# Patient Record
Sex: Female | Born: 1955 | Race: White | Hispanic: No | Marital: Married | Smoking: Never smoker
Health system: Southern US, Community
[De-identification: ages and names within clinical notes are randomized; demographics above are authoritative.]

## PROBLEM LIST (undated history)

## (undated) DIAGNOSIS — F419 Anxiety disorder, unspecified: Secondary | ICD-10-CM

## (undated) DIAGNOSIS — Z9889 Other specified postprocedural states: Secondary | ICD-10-CM

## (undated) DIAGNOSIS — R569 Unspecified convulsions: Secondary | ICD-10-CM

## (undated) DIAGNOSIS — T7840XA Allergy, unspecified, initial encounter: Secondary | ICD-10-CM

## (undated) DIAGNOSIS — I10 Essential (primary) hypertension: Secondary | ICD-10-CM

## (undated) DIAGNOSIS — T4145XA Adverse effect of unspecified anesthetic, initial encounter: Secondary | ICD-10-CM

## (undated) DIAGNOSIS — T8859XA Other complications of anesthesia, initial encounter: Secondary | ICD-10-CM

## (undated) DIAGNOSIS — R112 Nausea with vomiting, unspecified: Secondary | ICD-10-CM

## (undated) HISTORY — DX: Essential (primary) hypertension: I10

## (undated) HISTORY — DX: Allergy, unspecified, initial encounter: T78.40XA

## (undated) HISTORY — PX: TONSILLECTOMY: SUR1361

## (undated) HISTORY — DX: Anxiety disorder, unspecified: F41.9

## (undated) HISTORY — PX: SHOULDER ARTHROSCOPY: SHX128

## (undated) HISTORY — DX: Unspecified convulsions: R56.9

---

## 2004-04-03 ENCOUNTER — Ambulatory Visit: Payer: Self-pay | Admitting: Family Medicine

## 2004-05-20 ENCOUNTER — Ambulatory Visit: Payer: Self-pay | Admitting: Family Medicine

## 2004-05-20 ENCOUNTER — Other Ambulatory Visit: Admission: RE | Admit: 2004-05-20 | Discharge: 2004-05-20 | Payer: Self-pay | Admitting: Family Medicine

## 2005-04-14 ENCOUNTER — Ambulatory Visit: Payer: Self-pay | Admitting: Family Medicine

## 2006-04-25 ENCOUNTER — Ambulatory Visit: Payer: Self-pay | Admitting: Family Medicine

## 2006-05-23 ENCOUNTER — Ambulatory Visit: Payer: Self-pay | Admitting: Family Medicine

## 2006-05-24 ENCOUNTER — Encounter (INDEPENDENT_AMBULATORY_CARE_PROVIDER_SITE_OTHER): Payer: Self-pay | Admitting: Internal Medicine

## 2006-05-24 ENCOUNTER — Ambulatory Visit: Payer: Self-pay | Admitting: Internal Medicine

## 2006-05-28 ENCOUNTER — Encounter: Admission: RE | Admit: 2006-05-28 | Discharge: 2006-05-28 | Payer: Self-pay | Admitting: Family Medicine

## 2007-05-31 DIAGNOSIS — K2100 Gastro-esophageal reflux disease with esophagitis, without bleeding: Secondary | ICD-10-CM | POA: Insufficient documentation

## 2007-09-28 DIAGNOSIS — F4320 Adjustment disorder, unspecified: Secondary | ICD-10-CM | POA: Insufficient documentation

## 2007-10-27 DIAGNOSIS — G4700 Insomnia, unspecified: Secondary | ICD-10-CM | POA: Insufficient documentation

## 2007-12-21 ENCOUNTER — Ambulatory Visit: Payer: Self-pay | Admitting: Gastroenterology

## 2007-12-21 LAB — HM COLONOSCOPY

## 2009-06-18 ENCOUNTER — Encounter: Payer: Self-pay | Admitting: General Practice

## 2009-06-22 ENCOUNTER — Encounter: Payer: Self-pay | Admitting: General Practice

## 2009-07-08 ENCOUNTER — Ambulatory Visit: Payer: Self-pay | Admitting: Family Medicine

## 2009-07-23 ENCOUNTER — Encounter: Payer: Self-pay | Admitting: General Practice

## 2009-08-22 ENCOUNTER — Encounter: Payer: Self-pay | Admitting: General Practice

## 2009-09-22 ENCOUNTER — Encounter: Payer: Self-pay | Admitting: General Practice

## 2009-10-14 ENCOUNTER — Encounter: Payer: Self-pay | Admitting: Podiatry

## 2009-10-23 ENCOUNTER — Encounter: Payer: Self-pay | Admitting: Podiatry

## 2009-11-22 ENCOUNTER — Encounter: Payer: Self-pay | Admitting: Podiatry

## 2014-12-27 ENCOUNTER — Telehealth: Payer: Self-pay | Admitting: Family Medicine

## 2014-12-27 NOTE — Telephone Encounter (Signed)
Unfortunately needs ov. Not been here since EPIC conversion. Thanks.

## 2014-12-27 NOTE — Telephone Encounter (Signed)
Pt is requesting a referral to Endocrinology at [facility]. Pt stated that she has discussed this with Dr. Venia Minks before. I advised she may need an OV for referral. Please advise. Thanks TNP

## 2014-12-30 NOTE — Telephone Encounter (Signed)
LMTCB 12/30/2014  Thanks,   -Mickel Baas

## 2015-01-20 ENCOUNTER — Other Ambulatory Visit: Payer: Self-pay | Admitting: Family Medicine

## 2015-01-20 DIAGNOSIS — G4700 Insomnia, unspecified: Secondary | ICD-10-CM

## 2015-01-21 ENCOUNTER — Other Ambulatory Visit: Payer: Self-pay | Admitting: Family Medicine

## 2015-01-21 NOTE — Telephone Encounter (Signed)
Printed, please fax or call in to pharmacy. Thank you.   

## 2015-03-23 ENCOUNTER — Other Ambulatory Visit: Payer: Self-pay | Admitting: Family Medicine

## 2015-03-23 DIAGNOSIS — I10 Essential (primary) hypertension: Secondary | ICD-10-CM

## 2015-03-24 ENCOUNTER — Other Ambulatory Visit: Payer: Self-pay

## 2015-03-24 DIAGNOSIS — I10 Essential (primary) hypertension: Secondary | ICD-10-CM | POA: Insufficient documentation

## 2015-03-24 MED ORDER — TRIAMTERENE-HCTZ 37.5-25 MG PO TABS: 1.0000 | ORAL_TABLET | Freq: Every day | ORAL | Status: DC

## 2015-03-24 NOTE — Telephone Encounter (Signed)
05/20/14 last ov

## 2015-03-24 NOTE — Telephone Encounter (Signed)
Last OV 04/2014   

## 2015-04-30 ENCOUNTER — Other Ambulatory Visit: Payer: Self-pay

## 2015-04-30 DIAGNOSIS — I10 Essential (primary) hypertension: Secondary | ICD-10-CM

## 2015-04-30 DIAGNOSIS — G4700 Insomnia, unspecified: Secondary | ICD-10-CM

## 2015-04-30 MED ORDER — CLONAZEPAM 1 MG PO TABS: 0.5000 mg | ORAL_TABLET | Freq: Every day | ORAL | Status: DC

## 2015-04-30 MED ORDER — TRIAMTERENE-HCTZ 37.5-25 MG PO TABS: 1.0000 | ORAL_TABLET | Freq: Every day | ORAL | Status: DC

## 2015-04-30 NOTE — Telephone Encounter (Signed)
Patient needs Maxzide and Klonopin (just as listed in her medication list directions the same)_refilled to [facility]. LOV looks like from last message was March 2016, advised her wife that she will probably need appointment but will let the doctor decide. Please review. [phone]

## 2015-04-30 NOTE — Telephone Encounter (Signed)
Ok to refill? Please review.

## 2015-04-30 NOTE — Telephone Encounter (Signed)
Printed, please fax or call in to pharmacy. Thank you.   

## 2015-05-08 ENCOUNTER — Other Ambulatory Visit: Payer: Self-pay

## 2015-05-08 DIAGNOSIS — G4700 Insomnia, unspecified: Secondary | ICD-10-CM

## 2015-05-08 DIAGNOSIS — I10 Essential (primary) hypertension: Secondary | ICD-10-CM

## 2015-05-08 MED ORDER — CLONAZEPAM 1 MG PO TABS: 0.5000 mg | ORAL_TABLET | Freq: Every day | ORAL | Status: DC

## 2015-05-08 MED ORDER — TRIAMTERENE-HCTZ 37.5-25 MG PO TABS: 1.0000 | ORAL_TABLET | Freq: Every day | ORAL | Status: DC

## 2015-05-08 NOTE — Telephone Encounter (Signed)
Printed, please fax or call in to pharmacy. Thank you.   

## 2015-05-15 ENCOUNTER — Other Ambulatory Visit: Payer: Self-pay | Admitting: Family Medicine

## 2015-05-15 DIAGNOSIS — I10 Essential (primary) hypertension: Secondary | ICD-10-CM

## 2015-05-27 ENCOUNTER — Telehealth: Payer: Self-pay | Admitting: Family Medicine

## 2015-05-28 ENCOUNTER — Ambulatory Visit (INDEPENDENT_AMBULATORY_CARE_PROVIDER_SITE_OTHER): Payer: 59 | Admitting: Family Medicine

## 2015-05-28 ENCOUNTER — Encounter: Payer: Self-pay | Admitting: Family Medicine

## 2015-05-28 VITALS — BP 118/76 | HR 76 | Temp 98.6°F | Resp 16 | Ht 59.0 in | Wt 150.0 lb

## 2015-05-28 DIAGNOSIS — M722 Plantar fascial fibromatosis: Secondary | ICD-10-CM | POA: Insufficient documentation

## 2015-05-28 DIAGNOSIS — G4700 Insomnia, unspecified: Secondary | ICD-10-CM

## 2015-05-28 DIAGNOSIS — I10 Essential (primary) hypertension: Secondary | ICD-10-CM

## 2015-05-28 DIAGNOSIS — K594 Anal spasm: Secondary | ICD-10-CM | POA: Insufficient documentation

## 2015-05-28 DIAGNOSIS — H905 Unspecified sensorineural hearing loss: Secondary | ICD-10-CM | POA: Insufficient documentation

## 2015-05-28 DIAGNOSIS — F4322 Adjustment disorder with anxiety: Secondary | ICD-10-CM | POA: Diagnosis not present

## 2015-05-28 DIAGNOSIS — K2100 Gastro-esophageal reflux disease with esophagitis, without bleeding: Secondary | ICD-10-CM

## 2015-05-28 MED ORDER — CLONAZEPAM 1 MG PO TABS: ORAL_TABLET | ORAL | Status: DC

## 2015-05-28 MED ORDER — TRIAMTERENE-HCTZ 37.5-25 MG PO TABS: ORAL_TABLET | ORAL | Status: DC

## 2015-05-28 MED ORDER — CLONAZEPAM 1 MG PO TABS: 0.5000 mg | ORAL_TABLET | Freq: Every day | ORAL | Status: DC

## 2015-05-28 NOTE — Progress Notes (Signed)
Patient ID: Danielle Ortega, female   DOB: 08/15/1955, 60 y.o.   MRN: [id number]         Patient: Danielle Ortega Female    DOB: 16-Sep-1955   60 y.o.   MRN: [id number] Visit Date: 05/28/2015  Today's Provider: Margarita Rana, MD   No chief complaint on file.  Subjective:    Hypertension This is a chronic problem. The problem is unchanged. The problem is controlled. Associated symptoms include anxiety and malaise/fatigue. Pertinent negatives include no chest pain, headaches, palpitations or shortness of breath. There are no compliance problems.   Insomnia Primary symptoms: fragmented sleep, sleep disturbance, malaise/fatigue.  The problem has been gradually worsening since onset. How many beverages per day that contain caffeine: 0 - 1.  Types of beverages you drink: coffee. Past treatments include medication. Typical bedtime:  8-10 P.M..  How long after going to bed to you fall asleep: 15-30 minutes.   PMH includes: work related stressors.  Gastroesophageal Reflux She complains of heartburn and a hoarse voice. She reports no chest pain, no choking, no coughing, no nausea or no sore throat. This is a chronic problem. The heartburn wakes her from sleep. The symptoms are aggravated by certain foods and stress (Eat later in the day). Associated symptoms include fatigue.  Rectal Pain  Has been going on for several years. She describes it as a "Muscle Spasm" it comes and goes.  Pt says it is very random can happen anytime day or night, will wake pt up if it happens at night.     Not on File Previous Medications   ASCORBIC ACID (VITAMIN C) 1000 MG TABLET    Take by mouth.   ASPIRIN 325 MG TABLET    Take by mouth.   CHOLECALCIFEROL (VITAMIN D) 1000 UNITS TABLET    Take by mouth.   CLONAZEPAM (KLONOPIN) 1 MG TABLET    Take 0.5 tablets (0.5 mg total) by mouth at bedtime. Pt needs to schedule an office visit before anymore refills.   CRANBERRY-VITAMIN C-VITAMIN E (CRANBERRY PLUS VITAMIN C) 4200-20-3  MG-MG-UNIT CAPS    Take by mouth.   IBUPROFEN (ADVIL) 200 MG TABLET       KRILL OIL 1000 MG CAPS    Take by mouth.   MISC NATURAL PRODUCTS (GLUCOSAMINE CHONDROITIN COMPLX) TABS    Take by mouth.   PROBIOTIC CAPS    Take by mouth.   TRIAMTERENE-HYDROCHLOROTHIAZIDE (MAXZIDE-25) 37.5-25 MG TABLET    TAKE 1 TABLET BY MOUTH  DAILY.    Review of Systems  Constitutional: Positive for malaise/fatigue and fatigue. Negative for fever, diaphoresis, activity change, appetite change and unexpected weight change.  HENT: Positive for hoarse voice. Negative for sore throat.   Respiratory: Negative.  Negative for cough, choking and shortness of breath.   Cardiovascular: Positive for leg swelling. Negative for chest pain and palpitations.  Gastrointestinal: Positive for heartburn and rectal pain (Has been going on for several years. She describes it as a "Muscle Spasm" it comes an goes.). Negative for nausea, vomiting, diarrhea, constipation, blood in stool, abdominal distention and anal bleeding.  Neurological: Negative for dizziness, light-headedness and headaches.  Psychiatric/Behavioral: Positive for sleep disturbance. Negative for suicidal ideas, behavioral problems, confusion, self-injury, dysphoric mood, decreased concentration and agitation. The patient is nervous/anxious (Work related stress. ) and has insomnia. The patient is not hyperactive.     Social History  Substance Use Topics  . Smoking status: Never Smoker   . Smokeless tobacco: Never Used  .  Alcohol Use: Yes     Comment: Rarely   Objective:   BP 118/76 mmHg  Pulse 76  Temp(Src) 98.6 F (37 C) (Oral)  Resp 16  Ht 4\' 11"  (1.499 m)  Wt 150 lb (68.04 kg)  BMI 30.28 kg/m2  Physical Exam  Constitutional: She is oriented to person, place, and time. She appears well-developed and well-nourished.  Cardiovascular: Normal rate, regular rhythm and normal heart sounds.   Pulmonary/Chest: Effort normal and breath sounds normal.    Neurological: She is alert and oriented to person, place, and time.  Psychiatric: She has a normal mood and affect. Her behavior is normal. Judgment and thought content normal.      Assessment & Plan:     1. Essential hypertension, benign Condition is stable. Please continue current medication and  plan of care as noted.   - triamterene-hydrochlorothiazide (MAXZIDE-25) 37.5-25 MG tablet; TAKE 1 TABLET BY MOUTH  DAILY.  Dispense: 90 tablet; Refill: 1 - CBC with Differential/Platelet - Lipid panel - TSH - Comprehensive metabolic panel  2. Adjustment disorder with anxious mood Stable.  Continue current treatment.    3. Insomnia Stable. Continue current medication and plan of care.   - clonazePAM (KLONOPIN) 1 MG tablet; 1/2 - 1 at bedtime for insomnia.  Dispense: 60 tablet; Refill: 1 - TSH  4. Esophagitis, reflux Condition is stable. Please continue current medication and  plan of care as noted.   - CBC with Differential/Platelet - Lipid panel - TSH - Comprehensive metabolic panel   5. Proctalgia fugax Intermittent.  Did improve with Klonopin. Will take as needed.      Patient was seen and examined by Jerrell Belfast, MD, and note scribed by Ashley Royalty, CMA.  I have reviewed the document for accuracy and completeness and I agree with above. - Jerrell Belfast, MD   Margarita Rana, MD  [facility]

## 2015-06-06 ENCOUNTER — Telehealth: Payer: Self-pay

## 2015-06-06 DIAGNOSIS — E870 Hyperosmolality and hypernatremia: Secondary | ICD-10-CM

## 2015-06-06 LAB — COMPREHENSIVE METABOLIC PANEL
ALT: 31 IU/L (ref 0–32)
AST: 21 IU/L (ref 0–40)
Albumin/Globulin Ratio: 2.4 — ABNORMAL HIGH (ref 1.2–2.2)
Albumin: 4.7 g/dL (ref 3.6–4.8)
Alkaline Phosphatase: 52 IU/L (ref 39–117)
BUN/Creatinine Ratio: 16 (ref 12–28)
BUN: 14 mg/dL (ref 8–27)
Bilirubin Total: 0.4 mg/dL (ref 0.0–1.2)
CO2: 27 mmol/L (ref 18–29)
Calcium: 10.4 mg/dL — ABNORMAL HIGH (ref 8.7–10.3)
Chloride: 100 mmol/L (ref 96–106)
Creatinine, Ser: 0.9 mg/dL (ref 0.57–1.00)
GFR calc Af Amer: 79 mL/min/{1.73_m2} (ref >59)
GFR calc non Af Amer: 69 mL/min/{1.73_m2} (ref >59)
Globulin, Total: 2 g/dL (ref 1.5–4.5)
Glucose: 98 mg/dL (ref 65–99)
Potassium: 4.6 mmol/L (ref 3.5–5.2)
Sodium: 148 mmol/L — ABNORMAL HIGH (ref 134–144)
Total Protein: 6.7 g/dL (ref 6.0–8.5)

## 2015-06-06 LAB — CBC WITH DIFFERENTIAL/PLATELET
Basophils Absolute: 0 10*3/uL (ref 0.0–0.2)
Basos: 1 %
EOS (ABSOLUTE): 0.2 10*3/uL (ref 0.0–0.4)
Eos: 4 %
Hematocrit: 44.2 % (ref 34.0–46.6)
Hemoglobin: 14.9 g/dL (ref 11.1–15.9)
Immature Grans (Abs): 0 10*3/uL (ref 0.0–0.1)
Immature Granulocytes: 0 %
Lymphocytes Absolute: 1.8 10*3/uL (ref 0.7–3.1)
Lymphs: 34 %
MCH: 29.9 pg (ref 26.6–33.0)
MCHC: 33.7 g/dL (ref 31.5–35.7)
MCV: 89 fL (ref 79–97)
Monocytes Absolute: 0.4 10*3/uL (ref 0.1–0.9)
Monocytes: 7 %
Neutrophils Absolute: 2.9 10*3/uL (ref 1.4–7.0)
Neutrophils: 54 %
Platelets: 190 10*3/uL (ref 150–379)
RBC: 4.98 x10E6/uL (ref 3.77–5.28)
RDW: 13.7 % (ref 12.3–15.4)
WBC: 5.3 10*3/uL (ref 3.4–10.8)

## 2015-06-06 LAB — LIPID PANEL
Chol/HDL Ratio: 2.5 ratio units (ref 0.0–4.4)
Cholesterol, Total: 185 mg/dL (ref 100–199)
HDL: 74 mg/dL (ref >39)
LDL Calculated: 79 mg/dL (ref 0–99)
Triglycerides: 158 mg/dL — ABNORMAL HIGH (ref 0–149)
VLDL Cholesterol Cal: 32 mg/dL (ref 5–40)

## 2015-06-06 LAB — TSH: TSH: 3.37 u[IU]/mL (ref 0.450–4.500)

## 2015-06-06 NOTE — Telephone Encounter (Signed)
-----  Message from Margarita Rana, MD sent at 06/06/2015 10:51 AM EDT ----- Labs stable except for mildly elevated calcium and sodium. Would recommend stay well hydrated and recheck met c and PTH in one week. Thanks.

## 2015-06-06 NOTE — Telephone Encounter (Signed)
Advised pt of lab results. Pt verbally acknowledges understanding. Printed off lab req. Renaldo Fiddler, CMA

## 2015-07-08 ENCOUNTER — Other Ambulatory Visit: Payer: Self-pay

## 2015-07-08 DIAGNOSIS — G4700 Insomnia, unspecified: Secondary | ICD-10-CM

## 2015-07-08 MED ORDER — CLONAZEPAM 1 MG PO TABS: ORAL_TABLET | ORAL | Status: DC

## 2015-07-08 NOTE — Telephone Encounter (Signed)
Printed, please fax or call in to pharmacy. Thank you.   

## 2015-08-18 ENCOUNTER — Encounter: Payer: Self-pay | Admitting: Family Medicine

## 2015-08-18 ENCOUNTER — Ambulatory Visit
Admission: RE | Admit: 2015-08-18 | Discharge: 2015-08-18 | Disposition: A | Discharge status: Home / Self Care | Payer: 59 | Source: Ambulatory Visit | Attending: Family Medicine | Admitting: Family Medicine

## 2015-08-18 ENCOUNTER — Ambulatory Visit (INDEPENDENT_AMBULATORY_CARE_PROVIDER_SITE_OTHER): Payer: 59 | Admitting: Family Medicine

## 2015-08-18 ENCOUNTER — Other Ambulatory Visit: Payer: Self-pay | Admitting: Family Medicine

## 2015-08-18 VITALS — BP 96/56 | HR 76 | Temp 98.7°F | Resp 16 | Ht 59.0 in | Wt 146.0 lb

## 2015-08-18 DIAGNOSIS — R1010 Upper abdominal pain, unspecified: Secondary | ICD-10-CM

## 2015-08-18 DIAGNOSIS — M545 Low back pain: Secondary | ICD-10-CM

## 2015-08-18 DIAGNOSIS — R0789 Other chest pain: Secondary | ICD-10-CM

## 2015-08-18 DIAGNOSIS — K2100 Gastro-esophageal reflux disease with esophagitis, without bleeding: Secondary | ICD-10-CM

## 2015-08-18 MED ORDER — OMEPRAZOLE 20 MG PO CPDR: 20.0000 mg | DELAYED_RELEASE_CAPSULE | Freq: Every day | ORAL | Status: DC

## 2015-08-18 NOTE — Progress Notes (Signed)
Patient: Danielle Ortega Female    DOB: 06/23/1955   60 y.o.   MRN: [id number] Visit Date: 08/18/2015  Today's Provider: Margarita Rana, MD   Chief Complaint  Patient presents with  . Abdominal Pain   Subjective:    HPI Patient reports pain in upper abdomin and back pain times 2 weeks, worsening. Patient reports pain is constant all day. Reports pain is worse today with movement. Patient denies any injury, nausea, vomiting, or cough. Patient denies acid reflux or heartburn symptoms. Patient reports that she was bit by a horse fly 2 weeks ago on her right leg. Patient not sure if that bite is causing her symptoms. Patient reports that she stopped taking her antiacid medications a few weeks ago.     No Known Allergies Current Meds  Medication Sig  . Ascorbic Acid (VITAMIN C) 1000 MG tablet Take by mouth.  Marland Kitchen aspirin 325 MG tablet Take by mouth.  . clonazePAM (KLONOPIN) 1 MG tablet 1/2 - 1 at bedtime for insomnia.  . Cranberry-Vitamin C-Vitamin E (CRANBERRY PLUS VITAMIN C) 4200-20-3 MG-MG-UNIT CAPS Take by mouth.  Javier Docker Oil 1000 MG CAPS Take by mouth.  . Misc Natural Products (GLUCOSAMINE CHONDROITIN COMPLX) TABS Take by mouth.  . Probiotic CAPS Take by mouth.  . triamterene-hydrochlorothiazide (MAXZIDE-25) 37.5-25 MG tablet TAKE 1 TABLET BY MOUTH  DAILY.  . [DISCONTINUED] cholecalciferol (VITAMIN D) 1000 units tablet Take by mouth.  . [DISCONTINUED] ibuprofen (ADVIL) 200 MG tablet     Review of Systems  Constitutional: Negative.   Respiratory: Negative.   Musculoskeletal: Positive for myalgias and back pain.    Social History  Substance Use Topics  . Smoking status: Never Smoker   . Smokeless tobacco: Never Used  . Alcohol Use: Yes     Comment: Rarely   Objective:   BP 96/56 mmHg  Pulse 76  Temp(Src) 98.7 F (37.1 C) (Oral)  Resp 16  Ht 4\' 11"  (1.499 m)  Wt 146 lb (66.225 kg)  BMI 29.47 kg/m2  Physical Exam  Constitutional: She is oriented to person, place,  and time. She appears well-developed and well-nourished.  Cardiovascular: Normal rate, regular rhythm and normal heart sounds.   Abdominal: She exhibits distension. Bowel sounds are increased. There is no CVA tenderness.  Neurological: She is alert and oriented to person, place, and time.  Psychiatric: She has a normal mood and affect. Her behavior is normal. Judgment and thought content normal.      Assessment & Plan:     1. Atypical chest pain New problem. EKG no change.   Worsening. Only mild improvement with GI cocktail.  F/U pending lab report and ultrasound. Stressed importance of going to ER if worsening CP or SOB or new symptoms.  - EKG 12-Lead - GI COCKTAIL UP TO 45 CC - CBC with Differential/Platelet - Comprehensive metabolic panel - Amylase - Lipase  2. Pain of upper abdomen New problem. F/U pending report. - US Abdomen Limited RUQ; Future  3. Esophagitis, reflux Recurrent. Worsening. Patient started on omeprazole 20 mg daily.  4. Midline low back pain, with sciatica presence unspecified F/U pending lab report. - DG Thoracic Spine 1 View; Future      Patient seen and examined by Dr. Jerrell Belfast, and note scribed by Philbert Riser. Dimas, CMA.  I have reviewed the document for accuracy and completeness and I agree with above. - Jerrell Belfast, MD   Margarita Rana, MD  [facility]  [facility]  

## 2015-08-19 LAB — COMPREHENSIVE METABOLIC PANEL
ALT: 29 IU/L (ref 0–32)
AST: 21 IU/L (ref 0–40)
Albumin/Globulin Ratio: 1.8 (ref 1.2–2.2)
Albumin: 4.6 g/dL (ref 3.6–4.8)
Alkaline Phosphatase: 53 IU/L (ref 39–117)
BUN/Creatinine Ratio: 17 (ref 12–28)
BUN: 19 mg/dL (ref 8–27)
Bilirubin Total: 0.3 mg/dL (ref 0.0–1.2)
CO2: 29 mmol/L (ref 18–29)
Calcium: 10.9 mg/dL — ABNORMAL HIGH (ref 8.7–10.3)
Chloride: 105 mmol/L (ref 96–106)
Creatinine, Ser: 1.09 mg/dL — ABNORMAL HIGH (ref 0.57–1.00)
GFR calc Af Amer: 63 mL/min/{1.73_m2} (ref >59)
GFR calc non Af Amer: 55 mL/min/{1.73_m2} — ABNORMAL LOW (ref >59)
Globulin, Total: 2.6 g/dL (ref 1.5–4.5)
Glucose: 93 mg/dL (ref 65–99)
Potassium: 4.1 mmol/L (ref 3.5–5.2)
Sodium: 150 mmol/L — ABNORMAL HIGH (ref 134–144)
Total Protein: 7.2 g/dL (ref 6.0–8.5)

## 2015-08-19 LAB — LIPASE: Lipase: 23 U/L (ref 0–59)

## 2015-08-19 LAB — CBC WITH DIFFERENTIAL/PLATELET
Basophils Absolute: 0 10*3/uL (ref 0.0–0.2)
Basos: 1 %
EOS (ABSOLUTE): 0.2 10*3/uL (ref 0.0–0.4)
Eos: 4 %
Hematocrit: 43.4 % (ref 34.0–46.6)
Hemoglobin: 14.9 g/dL (ref 11.1–15.9)
Immature Grans (Abs): 0 10*3/uL (ref 0.0–0.1)
Immature Granulocytes: 0 %
Lymphocytes Absolute: 1.8 10*3/uL (ref 0.7–3.1)
Lymphs: 34 %
MCH: 30.8 pg (ref 26.6–33.0)
MCHC: 34.3 g/dL (ref 31.5–35.7)
MCV: 90 fL (ref 79–97)
Monocytes Absolute: 0.3 10*3/uL (ref 0.1–0.9)
Monocytes: 6 %
Neutrophils Absolute: 2.9 10*3/uL (ref 1.4–7.0)
Neutrophils: 55 %
Platelets: 190 10*3/uL (ref 150–379)
RBC: 4.84 x10E6/uL (ref 3.77–5.28)
RDW: 13.7 % (ref 12.3–15.4)
WBC: 5.3 10*3/uL (ref 3.4–10.8)

## 2015-08-19 LAB — AMYLASE: Amylase: 56 U/L (ref 31–124)

## 2015-08-20 ENCOUNTER — Telehealth: Payer: Self-pay

## 2015-08-20 NOTE — Telephone Encounter (Signed)
Pt advised; She says she is feeling much better.  She is going to get her labs today.   Thanks,   -Mickel Baas

## 2015-08-20 NOTE — Telephone Encounter (Signed)
-----   Message from Margarita Rana, MD sent at 08/19/2015  3:08 PM EDT ----- Xray negative.   Sodium and calcium still up. Needs repeat calcium with PTH. Please see how patient is feeling. Thanks.

## 2015-08-21 ENCOUNTER — Ambulatory Visit
Admission: RE | Admit: 2015-08-21 | Discharge: 2015-08-21 | Disposition: A | Discharge status: Home / Self Care | Payer: 59 | Source: Ambulatory Visit | Attending: Family Medicine | Admitting: Family Medicine

## 2015-08-21 DIAGNOSIS — K760 Fatty (change of) liver, not elsewhere classified: Secondary | ICD-10-CM | POA: Diagnosis not present

## 2015-08-21 DIAGNOSIS — R1010 Upper abdominal pain, unspecified: Secondary | ICD-10-CM | POA: Diagnosis not present

## 2015-08-21 LAB — COMPREHENSIVE METABOLIC PANEL
ALT: 27 IU/L (ref 0–32)
AST: 23 IU/L (ref 0–40)
Albumin/Globulin Ratio: 2.4 — ABNORMAL HIGH (ref 1.2–2.2)
Albumin: 4.6 g/dL (ref 3.6–4.8)
Alkaline Phosphatase: 50 IU/L (ref 39–117)
BUN/Creatinine Ratio: 19 (ref 12–28)
BUN: 18 mg/dL (ref 8–27)
Bilirubin Total: 0.3 mg/dL (ref 0.0–1.2)
CO2: 24 mmol/L (ref 18–29)
Calcium: 10.6 mg/dL — ABNORMAL HIGH (ref 8.7–10.3)
Chloride: 101 mmol/L (ref 96–106)
Creatinine, Ser: 0.96 mg/dL (ref 0.57–1.00)
GFR calc Af Amer: 73 mL/min/{1.73_m2} (ref >59)
GFR calc non Af Amer: 64 mL/min/{1.73_m2} (ref >59)
Globulin, Total: 1.9 g/dL (ref 1.5–4.5)
Glucose: 95 mg/dL (ref 65–99)
Potassium: 4.8 mmol/L (ref 3.5–5.2)
Sodium: 145 mmol/L — ABNORMAL HIGH (ref 134–144)
Total Protein: 6.5 g/dL (ref 6.0–8.5)

## 2015-08-21 LAB — PARATHYROID HORMONE, INTACT (NO CA)
PTH: 56 pg/mL (ref 15–65)
PTH: 35 pg/mL (ref 15–65)

## 2015-08-21 LAB — SPECIMEN STATUS REPORT

## 2015-11-12 ENCOUNTER — Encounter: Payer: Self-pay | Admitting: Physician Assistant

## 2015-11-12 ENCOUNTER — Ambulatory Visit (INDEPENDENT_AMBULATORY_CARE_PROVIDER_SITE_OTHER): Payer: 59 | Admitting: Physician Assistant

## 2015-11-12 VITALS — BP 112/72 | HR 71 | Temp 98.2°F | Resp 14 | Ht 59.5 in | Wt 144.8 lb

## 2015-11-12 DIAGNOSIS — K2100 Gastro-esophageal reflux disease with esophagitis, without bleeding: Secondary | ICD-10-CM

## 2015-11-12 DIAGNOSIS — Z1239 Encounter for other screening for malignant neoplasm of breast: Secondary | ICD-10-CM | POA: Diagnosis not present

## 2015-11-12 DIAGNOSIS — I10 Essential (primary) hypertension: Secondary | ICD-10-CM

## 2015-11-12 DIAGNOSIS — G4700 Insomnia, unspecified: Secondary | ICD-10-CM

## 2015-11-12 DIAGNOSIS — Z1159 Encounter for screening for other viral diseases: Secondary | ICD-10-CM

## 2015-11-12 DIAGNOSIS — Z0000 Encounter for general adult medical examination without abnormal findings: Secondary | ICD-10-CM | POA: Diagnosis not present

## 2015-11-12 MED ORDER — OMEPRAZOLE 20 MG PO CPDR: 20.0000 mg | DELAYED_RELEASE_CAPSULE | Freq: Every day | ORAL | 3 refills | Status: DC

## 2015-11-12 MED ORDER — TRIAMTERENE-HCTZ 37.5-25 MG PO TABS: ORAL_TABLET | ORAL | 3 refills | Status: DC

## 2015-11-12 MED ORDER — CLONAZEPAM 1 MG PO TABS: ORAL_TABLET | ORAL | 1 refills | Status: DC

## 2015-11-12 NOTE — Patient Instructions (Signed)
Health Maintenance, Female Adopting a healthy lifestyle and getting preventive care can go a long way to promote health and wellness. Talk with your health care provider about what schedule of regular examinations is right for you. This is a good chance for you to check in with your provider about disease prevention and staying healthy. In between checkups, there are plenty of things you can do on your own. Experts have done a lot of research about which lifestyle changes and preventive measures are most likely to keep you healthy. Ask your health care provider for more information. WEIGHT AND DIET  Eat a healthy diet  Be sure to include plenty of vegetables, fruits, low-fat dairy products, and lean protein.  Do not eat a lot of foods high in solid fats, added sugars, or salt.  Get regular exercise. This is one of the most important things you can do for your health.  Most adults should exercise for at least 150 minutes each week. The exercise should increase your heart rate and make you sweat (moderate-intensity exercise).  Most adults should also do strengthening exercises at least twice a week. This is in addition to the moderate-intensity exercise.  Maintain a healthy weight  Body mass index (BMI) is a measurement that can be used to identify possible weight problems. It estimates body fat based on height and weight. Your health care provider can help determine your BMI and help you achieve or maintain a healthy weight.  For females 15 years of age and older:   A BMI below 18.5 is considered underweight.  A BMI of 18.5 to 24.9 is normal.  A BMI of 25 to 29.9 is considered overweight.  A BMI of 30 and above is considered obese.  Watch levels of cholesterol and blood lipids  You should start having your blood tested for lipids and cholesterol at 62 years of age, then have this test every 5 years.  You may need to have your cholesterol levels checked more often if:  Your lipid  or cholesterol levels are high.  You are older than 62 years of age.  You are at high risk for heart disease.  CANCER SCREENING   Lung Cancer  Lung cancer screening is recommended for adults 85-86 years old who are at high risk for lung cancer because of a history of smoking.  A yearly low-dose CT scan of the lungs is recommended for people who:  Currently smoke.  Have quit within the past 15 years.  Have at least a 30-pack-year history of smoking. A pack year is smoking an average of one pack of cigarettes a day for 1 year.  Yearly screening should continue until it has been 15 years since you quit.  Yearly screening should stop if you develop a health problem that would prevent you from having lung cancer treatment.  Breast Cancer  Practice breast self-awareness. This means understanding how your breasts normally appear and feel.  It also means doing regular breast self-exams. Let your health care provider know about any changes, no matter how small.  If you are in your 20s or 30s, you should have a clinical breast exam (CBE) by a health care provider every 1-3 years as part of a regular health exam.  If you are 39 or older, have a CBE every year. Also consider having a breast X-ray (mammogram) every year.  If you have a family history of breast cancer, talk to your health care provider about genetic screening.  If you  are at high risk for breast cancer, talk to your health care provider about having an MRI and a mammogram every year.  Breast cancer gene (BRCA) assessment is recommended for women who have family members with BRCA-related cancers. BRCA-related cancers include:  Breast.  Ovarian.  Tubal.  Peritoneal cancers.  Results of the assessment will determine the need for genetic counseling and BRCA1 and BRCA2 testing. Cervical Cancer Your health care provider may recommend that you be screened regularly for cancer of the pelvic organs (ovaries, uterus, and  vagina). This screening involves a pelvic examination, including checking for microscopic changes to the surface of your cervix (Pap test). You may be encouraged to have this screening done every 3 years, beginning at age 68.  For women ages 63-65, health care providers may recommend pelvic exams and Pap testing every 3 years, or they may recommend the Pap and pelvic exam, combined with testing for human papilloma virus (HPV), every 5 years. Some types of HPV increase your risk of cervical cancer. Testing for HPV may also be done on women of any age with unclear Pap test results.  Other health care providers may not recommend any screening for nonpregnant women who are considered low risk for pelvic cancer and who do not have symptoms. Ask your health care provider if a screening pelvic exam is right for you.  If you have had past treatment for cervical cancer or a condition that could lead to cancer, you need Pap tests and screening for cancer for at least 20 years after your treatment. If Pap tests have been discontinued, your risk factors (such as having a new sexual partner) need to be reassessed to determine if screening should resume. Some women have medical problems that increase the chance of getting cervical cancer. In these cases, your health care provider may recommend more frequent screening and Pap tests. Colorectal Cancer  This type of cancer can be detected and often prevented.  Routine colorectal cancer screening usually begins at 62 years of age and continues through 61 years of age.  Your health care provider may recommend screening at an earlier age if you have risk factors for colon cancer.  Your health care provider may also recommend using home test kits to check for hidden blood in the stool.  A small camera at the end of a tube can be used to examine your colon directly (sigmoidoscopy or colonoscopy). This is done to check for the earliest forms of colorectal  cancer.  Routine screening usually begins at age 70.  Direct examination of the colon should be repeated every 5-10 years through 62 years of age. However, you may need to be screened more often if early forms of precancerous polyps or small growths are found. Skin Cancer  Check your skin from head to toe regularly.  Tell your health care provider about any new moles or changes in moles, especially if there is a change in a mole's shape or color.  Also tell your health care provider if you have a mole that is larger than the size of a pencil eraser.  Always use sunscreen. Apply sunscreen liberally and repeatedly throughout the day.  Protect yourself by wearing long sleeves, pants, a wide-brimmed hat, and sunglasses whenever you are outside. HEART DISEASE, DIABETES, AND HIGH BLOOD PRESSURE   High blood pressure causes heart disease and increases the risk of stroke. High blood pressure is more likely to develop in:  People who have blood pressure in the high end  of the normal range (130-139/85-89 mm Hg).  People who are overweight or obese.  People who are African American.  If you are 38-23 years of age, have your blood pressure checked every 3-5 years. If you are 61 years of age or older, have your blood pressure checked every year. You should have your blood pressure measured twice--once when you are at a hospital or clinic, and once when you are not at a hospital or clinic. Record the average of the two measurements. To check your blood pressure when you are not at a hospital or clinic, you can use:  An automated blood pressure machine at a pharmacy.  A home blood pressure monitor.  If you are between 45 years and 39 years old, ask your health care provider if you should take aspirin to prevent strokes.  Have regular diabetes screenings. This involves taking a blood sample to check your fasting blood sugar level.  If you are at a normal weight and have a low risk for diabetes,  have this test once every three years after 62 years of age.  If you are overweight and have a high risk for diabetes, consider being tested at a younger age or more often. PREVENTING INFECTION  Hepatitis B  If you have a higher risk for hepatitis B, you should be screened for this virus. You are considered at high risk for hepatitis B if:  You were born in a country where hepatitis B is common. Ask your health care provider which countries are considered high risk.  Your parents were born in a high-risk country, and you have not been immunized against hepatitis B (hepatitis B vaccine).  You have HIV or AIDS.  You use needles to inject street drugs.  You live with someone who has hepatitis B.  You have had sex with someone who has hepatitis B.  You get hemodialysis treatment.  You take certain medicines for conditions, including cancer, organ transplantation, and autoimmune conditions. Hepatitis C  Blood testing is recommended for:  Everyone born from [birth year] through 1967.  Anyone with known risk factors for hepatitis C. Sexually transmitted infections (STIs)  You should be screened for sexually transmitted infections (STIs) including gonorrhea and chlamydia if:  You are sexually active and are younger than 62 years of age.  You are older than 62 years of age and your health care provider tells you that you are at risk for this type of infection.  Your sexual activity has changed since you were last screened and you are at an increased risk for chlamydia or gonorrhea. Ask your health care provider if you are at risk.  If you do not have HIV, but are at risk, it may be recommended that you take a prescription medicine daily to prevent HIV infection. This is called pre-exposure prophylaxis (PrEP). You are considered at risk if:  You are sexually active and do not regularly use condoms or know the HIV status of your partner(s).  You take drugs by injection.  You are sexually  active with a partner who has HIV. Talk with your health care provider about whether you are at high risk of being infected with HIV. If you choose to begin PrEP, you should first be tested for HIV. You should then be tested every 3 months for as long as you are taking PrEP.  PREGNANCY   If you are premenopausal and you may become pregnant, ask your health care provider about preconception counseling.  If you may  become pregnant, take 400 to 800 micrograms (mcg) of folic acid every day.  If you want to prevent pregnancy, talk to your health care provider about birth control (contraception). OSTEOPOROSIS AND MENOPAUSE   Osteoporosis is a disease in which the bones lose minerals and strength with aging. This can result in serious bone fractures. Your risk for osteoporosis can be identified using a bone density scan.  If you are 74 years of age or older, or if you are at risk for osteoporosis and fractures, ask your health care provider if you should be screened.  Ask your health care provider whether you should take a calcium or vitamin D supplement to lower your risk for osteoporosis.  Menopause may have certain physical symptoms and risks.  Hormone replacement therapy may reduce some of these symptoms and risks. Talk to your health care provider about whether hormone replacement therapy is right for you.  HOME CARE INSTRUCTIONS   Schedule regular health, dental, and eye exams.  Stay current with your immunizations.   Do not use any tobacco products including cigarettes, chewing tobacco, or electronic cigarettes.  If you are pregnant, do not drink alcohol.  If you are breastfeeding, limit how much and how often you drink alcohol.  Limit alcohol intake to no more than 1 drink per day for nonpregnant women. One drink equals 12 ounces of beer, 5 ounces of wine, or 1 ounces of hard liquor.  Do not use street drugs.  Do not share needles.  Ask your health care provider for help if  you need support or information about quitting drugs.  Tell your health care provider if you often feel depressed.  Tell your health care provider if you have ever been abused or do not feel safe at home.   This information is not intended to replace advice given to you by your health care provider. Make sure you discuss any questions you have with your health care provider.   Document Released: 08/24/2010 Document Revised: 03/01/2014 Document Reviewed: 01/10/2013 Elsevier Interactive Patient Education [employer].

## 2015-11-12 NOTE — Progress Notes (Signed)
Patient: Danielle Ortega, Female    DOB: 11-05-1955, 60 y.o.   MRN: [id number] Visit Date: 11/12/2015  Today's Provider: Mar Daring, PA-C   Chief Complaint  Patient presents with  . Annual Exam   Subjective:    Annual physical exam Danielle Ortega is a 60 y.o. female who presents today for health maintenance and complete physical. She feels well. She reports exercising none. She reports she is sleeping well (average 7.5 hours per night).  ----------------------------------------------------------------- Colonoscopy: 12/21/2007 polyp WNL Dr. Allen Norris Mammo: 10/18/2007 BI Rads 1 Pap Smear: 10/05/2007  Review of Systems  Constitutional: Positive for fatigue.       Irritability   HENT: Positive for voice change.   Eyes: Negative.   Respiratory: Negative.   Cardiovascular: Positive for leg swelling.  Gastrointestinal: Positive for rectal pain (sporadically ;over last 15 years).  Endocrine: Negative.   Genitourinary: Negative.   Musculoskeletal: Negative.   Skin: Negative.   Allergic/Immunologic: Negative.   Neurological: Negative.   Hematological: Negative.   Psychiatric/Behavioral: Negative.     Social History      She  reports that she has never smoked. She has never used smokeless tobacco. She reports that she drinks alcohol. She reports that she does not use drugs.       Social History   Social History  . Marital status: Married    Spouse name: N/A  . Number of children: N/A  . Years of education: N/A   Social History Main Topics  . Smoking status: Never Smoker  . Smokeless tobacco: Never Used  . Alcohol use Yes     Comment: Rarely  . Drug use: No  . Sexual activity: Not Asked   Other Topics Concern  . None   Social History Narrative  . None    No past medical history on file.   Patient Active Problem List   Diagnosis Date Noted  . Serum calcium elevated 08/20/2015  . Plantar fasciitis 05/28/2015  . 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 Essential hypertension, benign 03/24/2015  . Insomnia 10/27/2007  . 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 Esophagitis, reflux 05/31/2007    Past Surgical History:  Procedure Laterality Date  . SHOULDER ARTHROSCOPY Left   . TONSILLECTOMY      Family History        Family Status  Relation Status  . Mother Deceased  . Father Alive  . Brother Alive        Her family history includes Hypertension in her father; Lung cancer in her mother.    No Known Allergies  Current Meds  Medication Sig  . Ascorbic Acid (VITAMIN C) 1000 MG tablet Take by mouth.  Marland Kitchen aspirin 325 MG tablet Take by mouth.  . clonazePAM (KLONOPIN) 1 MG tablet 1/2 - 1 at bedtime for insomnia.  . Cranberry-Vitamin C-Vitamin E (CRANBERRY PLUS VITAMIN C) 4200-20-3 MG-MG-UNIT CAPS Take by mouth.  . Digestive Enzymes (DIGESTIVE ENZYME PO) Take by mouth.  Javier Docker Oil 1000 MG CAPS Take by mouth.  . Misc Natural Products (GLUCOSAMINE CHONDROITIN COMPLX) TABS Take by mouth.  Marland Kitchen omeprazole (PRILOSEC) 20 MG capsule Take 1 capsule (20 mg total) by mouth daily.  Marland Kitchen OVER THE COUNTER MEDICATION   . Probiotic CAPS Take by mouth.  . triamterene-hydrochlorothiazide (MAXZIDE-25) 37.5-25 MG tablet TAKE 1 TABLET BY MOUTH  DAILY.    Patient Care Team: Margarita Rana, MD as PCP - General (Family Medicine)  Objective:   Vitals: BP 112/72 (BP Location: Right Arm, Patient Position: Sitting, Cuff Size: Normal)   Pulse 71   Temp 98.2 F (36.8 C) (Oral)   Resp 14   Ht 4' 11.5" (1.511 m)   Wt 144 lb 12.8 oz (65.7 kg)   BMI 28.76 kg/m    Physical Exam  Constitutional: She is oriented to person, place, and time. She appears well-developed and well-nourished. No distress.  HENT:  Head: Normocephalic and atraumatic.  Right Ear: Tympanic membrane, external ear and ear canal normal.  Left Ear: Tympanic membrane, external ear and ear canal normal.  Nose: Nose normal.  Mouth/Throat: Uvula is midline,  oropharynx is clear and moist and mucous membranes are normal. No oropharyngeal exudate.  Eyes: Conjunctivae and EOM are normal. Pupils are equal, round, and reactive to light. Right eye exhibits no discharge. Left eye exhibits no discharge. No scleral icterus.  Neck: Normal range of motion. Neck supple. No JVD present. No tracheal deviation present. No thyromegaly present.  Cardiovascular: Normal rate, regular rhythm, normal heart sounds and intact distal pulses.  Exam reveals no gallop and no friction rub.   No murmur heard. Pulmonary/Chest: Effort normal and breath sounds normal. No respiratory distress. She has no wheezes. She has no rales. She exhibits no tenderness. Right breast exhibits no inverted nipple, no mass, no nipple discharge, no skin change and no tenderness. Left breast exhibits no inverted nipple, no mass, no nipple discharge, no skin change and no tenderness. Breasts are symmetrical.  Abdominal: Soft. Bowel sounds are normal. She exhibits no distension and no mass. There is no tenderness. There is no rebound and no guarding.  Genitourinary:  Genitourinary Comments: Patient refused due to normal paps.  Musculoskeletal: Normal range of motion. She exhibits no edema or tenderness.  Lymphadenopathy:    She has no cervical adenopathy.  Neurological: She is alert and oriented to person, place, and time.  Skin: Skin is warm and dry. No rash noted. She is not diaphoretic.  Psychiatric: She has a normal mood and affect. Her behavior is normal. Judgment and thought content normal.  Vitals reviewed.    Depression Screen No flowsheet data found.    Assessment & Plan:     Routine Health Maintenance and Physical Exam  Exercise Activities and Dietary recommendations Goals    None       There is no immunization history on file for this patient.  Health Maintenance  Topic Date Due  . Hepatitis C Screening  Dec 16, 1955  . HIV Screening  05/16/1968  . TETANUS/TDAP   05/16/1972  . PAP SMEAR  05/17/1974  . MAMMOGRAM  05/17/2003  . COLONOSCOPY  05/17/2003  . ZOSTAVAX  05/16/2013  . INFLUENZA VACCINE  09/23/2015      Discussed health benefits of physical activity, and encouraged her to engage in regular exercise appropriate for her age and condition.    1. Annual physical exam Normal physical exam today. Will follow up in one year at her next annual physical exam. She is to call the office in the meantime if she has any acute issue, questions or concerns.  2. Breast cancer screening Breast exam today was normal. There is no family history of breast cancer. She does perform regular self breast exams. Mammogram was ordered as below. Information for [facility] clinic was given to patient so she may schedule her mammogram at her convenience. - Mammogram Digital Screening; Future  3. Essential hypertension, benign Stable. Diagnosis pulled for medication refill. Continue  current medical treatment plan. - triamterene-hydrochlorothiazide (MAXZIDE-25) 37.5-25 MG tablet; TAKE 1 TABLET BY MOUTH  DAILY.  Dispense: 90 tablet; Refill: 3  4. Need for hepatitis C screening test - Hepatitis C antibody screen  5. Esophagitis, reflux Stable. Diagnosis pulled for medication refill. Continue current medical treatment plan. - omeprazole (PRILOSEC) 20 MG capsule; Take 1 capsule (20 mg total) by mouth daily.  Dispense: 90 capsule; Refill: 3  6. Insomnia Stable. Diagnosis pulled for medication refill. Continue current medical treatment plan. - clonazePAM (KLONOPIN) 1 MG tablet; 1/2 - 1 at bedtime for insomnia.  Dispense: 90 tablet; Refill: 1  --------------------------------------------------------------------    Mar Daring, PA-C  [facility]

## 2016-01-14 ENCOUNTER — Ambulatory Visit (INDEPENDENT_AMBULATORY_CARE_PROVIDER_SITE_OTHER): Payer: 59 | Admitting: Physician Assistant

## 2016-01-14 ENCOUNTER — Encounter: Payer: Self-pay | Admitting: Physician Assistant

## 2016-01-14 VITALS — BP 138/84 | HR 68 | Temp 98.5°F | Resp 16 | Wt 149.0 lb

## 2016-01-14 DIAGNOSIS — J029 Acute pharyngitis, unspecified: Secondary | ICD-10-CM | POA: Diagnosis not present

## 2016-01-14 DIAGNOSIS — R059 Cough, unspecified: Secondary | ICD-10-CM

## 2016-01-14 DIAGNOSIS — R05 Cough: Secondary | ICD-10-CM

## 2016-01-14 LAB — POCT RAPID STREP A (OFFICE): Rapid Strep A Screen: NEGATIVE

## 2016-01-14 MED ORDER — HYDROCODONE-HOMATROPINE 5-1.5 MG/5ML PO SYRP: 5.0000 mL | ORAL_SOLUTION | Freq: Three times a day (TID) | ORAL | 0 refills | Status: DC | PRN

## 2016-01-14 NOTE — Patient Instructions (Signed)
Infeccin del tracto respiratorio superior, adultos (Upper Respiratory Infection, Adult) La mayora de las infecciones del tracto respiratorio superior estn causadas por un virus. Un infeccin del tracto respiratorio superior afecta la nariz, la garganta y las vas respiratorias superiores. El tipo ms comn de infeccin del tracto respiratorio superior es el resfro comn. [place] los medicamentos solamente como se lo haya indicado el mdico.  A fin de Best boy de garganta, haga grgaras con solucin salina templada o consuma caramelos para la tos, como se lo haya indicado el mdico.  Use un humidificador de vapor clido o inhale el vapor de la ducha para aumentar la humedad del aire. Esto facilitar la respiracin.  Beba suficiente lquido para mantener el pis (orina) claro o de color amarillo plido.  Tome sopas y caldos transparentes.  Siga una dieta saludable.  Descanse todo lo que sea necesario.  Regrese al [place] cuando la fiebre haya desaparecido o el mdico le diga que puede [place].  Es posible que deba quedarse en su casa durante un tiempo prolongado, para no transmitir la infeccin a los dems.  [place] usar un barbijo y lavarse las manos con frecuencia para evitar el contagio del virus.  Si tiene asma, use el inhalador con mayor frecuencia.  No consuma ningn producto que contenga tabaco, lo que incluye cigarrillos, tabaco de Higher education careers adviser o Psychologist, sport and exercise. Si necesita ayuda para dejar de fumar, consulte al mdico. SOLICITE AYUDA SI:  Siente que empeora o que no mejora.  Los medicamentos no logran [employer].  Tiene escalofros.  La dificultad para Museum/gallery exhibitions officer.  Tiene mucosidad marrn o roja.  Tiene una secrecin amarilla o marrn de la Lawyer.  Le duele la cara, especialmente al inclinarse hacia adelante.  Tiene fiebre.  Tiene los ganglios del cuello hinchados.  Siente dolor al tragar.  Tiene zonas  blancas en la parte de atrs de la garganta. SOLICITE AYUDA DE INMEDIATO SI:  Los siguientes sntomas son muy intensos o constantes:  Dolor de [place].  Dolor de odos.  Dolor en la frente, detrs de los ojos y por encima de los pmulos (dolor sinusal).  Dolor en el pecho.  Tiene enfermedad pulmonar prolongada (crnica) y cualquiera de estos sntomas:  Sibilancias.  Tos prolongada.  Tos con sangre.  Cambio en la mucosidad habitual.  Presenta rigidez en el cuello.  Tiene cambios en:  La visin.  La audicin.  El pensamiento.  El [place] de nimo. ASEGRESE DE QUE:  Comprende estas instrucciones.  Controlar su afeccin.  Recibir ayuda de inmediato si no mejora o si empeora. Esta informacin no tiene Marine scientist el consejo del mdico. Asegrese de hacerle al mdico cualquier pregunta que tenga. Document Released: 07/13/2010 Document Revised: 06/25/2014 Document Reviewed: 05/16/2013 Elsevier Interactive Patient Education  2017 [employer].

## 2016-01-14 NOTE — Progress Notes (Signed)
Patient: Danielle Ortega Female    DOB: 1955/04/03   60 y.o.   MRN: [id number] Visit Date: 01/14/2016  Today's Provider: Trinna Post, PA-C   Chief Complaint  Patient presents with  . URI   Subjective:    HPI Pt is here for cough, congestion, headache, sore throat that has been going on for several days. She reports that the worse part of it is the sore throat. She denies fever, chills, sweats but has had some body aches. Denies nausea and vomiting.     No Known Allergies   Current Outpatient Prescriptions:  .  Ascorbic Acid (VITAMIN C) 1000 MG tablet, Take by mouth., Disp: , Rfl:  .  aspirin 325 MG tablet, Take by mouth., Disp: , Rfl:  .  clonazePAM (KLONOPIN) 1 MG tablet, 1/2 - 1 at bedtime for insomnia., Disp: 90 tablet, Rfl: 1 .  Cranberry-Vitamin C-Vitamin E (CRANBERRY PLUS VITAMIN C) 4200-20-3 MG-MG-UNIT CAPS, Take by mouth., Disp: , Rfl:  .  Digestive Enzymes (DIGESTIVE ENZYME PO), Take by mouth., Disp: , Rfl:  .  Krill Oil 1000 MG CAPS, Take by mouth., Disp: , Rfl:  .  Misc Natural Products (GLUCOSAMINE CHONDROITIN COMPLX) TABS, Take by mouth., Disp: , Rfl:  .  omeprazole (PRILOSEC) 20 MG capsule, Take 1 capsule (20 mg total) by mouth daily., Disp: 90 capsule, Rfl: 3 .  Probiotic CAPS, Take by mouth., Disp: , Rfl:  .  triamterene-hydrochlorothiazide ([id number]) 37.5-25 MG tablet, TAKE 1 TABLET BY MOUTH  DAILY., Disp: 90 tablet, Rfl: 3 .  FLUVIRIN 0.5 ML SUSY, ADM 0.5ML IM UTD, Disp: , Rfl: 0 .  OVER THE COUNTER MEDICATION, , Disp: , Rfl:   Review of Systems  Constitutional: Positive for fatigue.  HENT: Positive for congestion, postnasal drip and sore throat.   Eyes: Negative.   Respiratory: Positive for cough.   Cardiovascular: Negative.   Gastrointestinal: Negative.   Endocrine: Negative.   Genitourinary: Negative.   Musculoskeletal: Positive for myalgias.  Skin: Negative.   Allergic/Immunologic: Negative.   Neurological: Negative.   Hematological:  Negative.   Psychiatric/Behavioral: Negative.     Social History  Substance Use Topics  . Smoking status: Never Smoker  . Smokeless tobacco: Never Used  . Alcohol use Yes     Comment: Rarely   Objective:   BP 138/84 (BP Location: Left Arm, Patient Position: Sitting, Cuff Size: Normal)   Pulse 68   Temp 98.5 F (36.9 C) (Oral)   Resp 16   Wt 149 lb (67.6 kg)   SpO2 98%   BMI 29.59 kg/m   Physical Exam  Constitutional: She appears well-developed and well-nourished.  HENT:  Right Ear: External ear normal.  Left Ear: External ear normal.  Mouth/Throat: Posterior oropharyngeal erythema present. No oropharyngeal exudate, posterior oropharyngeal edema or tonsillar abscesses.  Eyes: Right eye exhibits discharge. Left eye exhibits discharge.  Neck: Neck supple.  Cardiovascular: Normal rate and regular rhythm.   Pulmonary/Chest: Effort normal and breath sounds normal. No respiratory distress. She has no rales.  Lymphadenopathy:    She has cervical adenopathy.  Skin: Skin is warm and dry.  Psychiatric: She has a normal mood and affect. Her behavior is normal.        Assessment & Plan:      Problem List Items Addressed This Visit    None    Visit Diagnoses    Cough    -  Primary   Relevant Medications  HYDROcodone-homatropine (HYCODAN) 5-1.5 MG/5ML syrup   Sore throat       Relevant Orders   POCT rapid strep A (Completed)      Patient is 60 y/o coming in today with upper respiratory infection, likely viral. She has not tried anything for her symptoms. Rapid strep in office today was negative. Will treat symptomatically with Tylenol and cough medicine.  No Follow-up on file.  There are no Patient Instructions on file for this visit.  The entirety of the information documented in the History of Present Illness, Review of Systems and Physical Exam were personally obtained by me. Portions of this information were initially documented by Ashley Royalty, CMA and reviewed by  me for thoroughness and accuracy.        Trinna Post, PA-C  [facility]

## 2016-05-06 ENCOUNTER — Ambulatory Visit (INDEPENDENT_AMBULATORY_CARE_PROVIDER_SITE_OTHER): Payer: 59 | Admitting: Physician Assistant

## 2016-05-06 ENCOUNTER — Encounter: Payer: Self-pay | Admitting: Physician Assistant

## 2016-05-06 VITALS — BP 104/72 | HR 60 | Temp 97.7°F | Resp 16 | Wt 134.0 lb

## 2016-05-06 DIAGNOSIS — R0981 Nasal congestion: Secondary | ICD-10-CM

## 2016-05-06 DIAGNOSIS — J0110 Acute frontal sinusitis, unspecified: Secondary | ICD-10-CM | POA: Diagnosis not present

## 2016-05-06 DIAGNOSIS — R05 Cough: Secondary | ICD-10-CM

## 2016-05-06 DIAGNOSIS — R059 Cough, unspecified: Secondary | ICD-10-CM

## 2016-05-06 MED ORDER — HYDROCODONE-HOMATROPINE 5-1.5 MG/5ML PO SYRP
5.0000 mL | ORAL_SOLUTION | Freq: Every evening | ORAL | 0 refills | Status: DC | PRN
Start: 2016-05-06 — End: 2016-12-27

## 2016-05-06 MED ORDER — AMOXICILLIN-POT CLAVULANATE 875-125 MG PO TABS: 1.0000 | ORAL_TABLET | Freq: Two times a day (BID) | ORAL | 0 refills | Status: DC

## 2016-05-06 MED ORDER — FLUTICASONE PROPIONATE 50 MCG/ACT NA SUSP
2.0000 | Freq: Every day | NASAL | 6 refills | Status: DC
Start: 2016-05-06 — End: 2016-05-06

## 2016-05-06 MED ORDER — FLUTICASONE PROPIONATE 50 MCG/ACT NA SUSP: 2.0000 | Freq: Every day | NASAL | 6 refills | Status: DC

## 2016-05-06 NOTE — Progress Notes (Signed)
Patient: Danielle Ortega Female    DOB: 1955-06-21   61 y.o.   MRN: [id number] Visit Date: 05/06/2016  Today's Provider: Trinna Post, PA-C   Chief Complaint  Patient presents with  . URI   Subjective:    URI   This is a recurrent problem. The current episode started in the past 7 days (over the last 5 days). The problem has been gradually worsening. Associated symptoms include congestion, coughing, headaches, sinus pain and swollen glands. She has tried decongestant and acetaminophen (Mucinex and robutussion) for the symptoms. The treatment provided mild relief.       No Known Allergies   Current Outpatient Prescriptions:  .  Ascorbic Acid (VITAMIN C) 1000 MG tablet, Take by mouth., Disp: , Rfl:  .  aspirin 325 MG tablet, Take by mouth., Disp: , Rfl:  .  clonazePAM (KLONOPIN) 1 MG tablet, 1/2 - 1 at bedtime for insomnia., Disp: 90 tablet, Rfl: 1 .  Cranberry-Vitamin C-Vitamin E (CRANBERRY PLUS VITAMIN C) 4200-20-3 MG-MG-UNIT CAPS, Take by mouth., Disp: , Rfl:  .  Digestive Enzymes (DIGESTIVE ENZYME PO), Take by mouth., Disp: , Rfl:  .  FLUVIRIN 0.5 ML SUSY, ADM 0.5ML IM UTD, Disp: , Rfl: 0 .  HYDROcodone-homatropine (HYCODAN) 5-1.5 MG/5ML syrup, Take 5 mLs by mouth every 8 (eight) hours as needed for cough., Disp: 120 mL, Rfl: 0 .  Krill Oil 1000 MG CAPS, Take by mouth., Disp: , Rfl:  .  Misc Natural Products (GLUCOSAMINE CHONDROITIN COMPLX) TABS, Take by mouth., Disp: , Rfl:  .  omeprazole (PRILOSEC) 20 MG capsule, Take 1 capsule (20 mg total) by mouth daily., Disp: 90 capsule, Rfl: 3 .  Probiotic CAPS, Take by mouth., Disp: , Rfl:  .  triamterene-hydrochlorothiazide ([id number]-76) 37.5-25 MG tablet, TAKE 1 TABLET BY MOUTH  DAILY., Disp: 90 tablet, Rfl: 3 .  OVER THE COUNTER MEDICATION, , Disp: , Rfl:   Review of Systems  HENT: Positive for congestion and sinus pain.   Respiratory: Positive for cough.   Neurological: Positive for headaches.    Social History    Substance Use Topics  . Smoking status: Never Smoker  . Smokeless tobacco: Never Used  . Alcohol use Yes     Comment: Rarely   Objective:   BP 104/72 (BP Location: Left Arm, Patient Position: Sitting, Cuff Size: Normal)   Pulse 60   Temp 97.7 F (36.5 C)   Resp 16   Wt 134 lb (60.8 kg)   BMI 26.61 kg/m  Vitals:   05/06/16 1506  BP: 104/72  Pulse: 60  Resp: 16  Temp: 97.7 F (36.5 C)  Weight: 134 lb (60.8 kg)     Physical Exam  Constitutional: She is oriented to person, place, and time. She appears well-developed and well-nourished. No distress.  HENT:  Right Ear: External ear normal.  Left Ear: External ear normal.  Nose: Right sinus exhibits maxillary sinus tenderness and frontal sinus tenderness. Left sinus exhibits maxillary sinus tenderness and frontal sinus tenderness.  Mouth/Throat: Oropharynx is clear and moist. No oropharyngeal exudate, posterior oropharyngeal edema or posterior oropharyngeal erythema.  Tms opaque bilaterally   Eyes: Conjunctivae are normal. Right eye exhibits no discharge. Left eye exhibits no discharge.  Neck: Neck supple.  Cardiovascular: Normal rate and regular rhythm.   Pulmonary/Chest: Effort normal and breath sounds normal.  Lymphadenopathy:    She has cervical adenopathy.  Neurological: She is alert and oriented to person, place, and time.  Skin: Skin is warm and dry. She is not diaphoretic.  Psychiatric: She has a normal mood and affect. Her behavior is normal.        Assessment & Plan:     1. Acute non-recurrent frontal sinusitis  Also instructed pt to take 2nd gen antihistamine and flonase for better maintenance control of potential allergies.  - amoxicillin-clavulanate (AUGMENTIN) 875-125 MG tablet; Take 1 tablet by mouth 2 (two) times daily.  Dispense: 20 tablet; Refill: 0  2. Cough  - HYDROcodone-homatropine (HYCODAN) 5-1.5 MG/5ML syrup; Take 5 mLs by mouth at bedtime as needed for cough.  Dispense: 120 mL; Refill:  0  3. Sinus congestion  - fluticasone (FLONASE) 50 MCG/ACT nasal spray; Place 2 sprays into both nostrils daily.  Dispense: 16 g; Refill: 6  The entirety of the information documented in the History of Present Illness, Review of Systems and Physical Exam were personally obtained by me. Portions of this information were initially documented by Ashley Royalty, CMA and reviewed by me for thoroughness and accuracy.   Return if symptoms worsen or fail to improve.        Trinna Post, PA-C  [facility]

## 2016-05-06 NOTE — Patient Instructions (Signed)

## 2016-05-08 ENCOUNTER — Ambulatory Visit: Payer: 59 | Admitting: Family Medicine

## 2016-05-08 ENCOUNTER — Telehealth: Payer: Self-pay | Admitting: Family Medicine

## 2016-05-08 MED ORDER — AZITHROMYCIN 250 MG PO TABS: ORAL_TABLET | ORAL | 0 refills | Status: DC

## 2016-05-08 NOTE — Telephone Encounter (Signed)
Pt's spouse Izora Gala called b/c pt still hasn't gotten her amoxicillin-clavulanate (AUGMENTIN) 875-125 MG tablet b/c it was sent to mail order pharmacy and since they have already filed the insurance [facility] can't fill the medication. Izora Gala is requesting we do something to fix this where they can get that medication or call something else in. Izora Gala thinks that pt is getting worse. [place] Please advise. Thanks TNP

## 2016-05-08 NOTE — Telephone Encounter (Signed)
I called [facility] but the doctor line to the pharmacy was closed and only open Monday through Friday. Called [facility] and called in [place] per Dr Caryn Section and pharmacist said this is going through with her insurance. Mrs Danielle Ortega advised. Apologized for the mix up and run around.-aa

## 2016-05-11 ENCOUNTER — Ambulatory Visit
Admission: RE | Admit: 2016-05-11 | Discharge: 2016-05-11 | Disposition: A | Discharge status: Home / Self Care | Payer: 59 | Source: Ambulatory Visit | Attending: Physician Assistant | Admitting: Physician Assistant

## 2016-05-11 ENCOUNTER — Telehealth: Payer: Self-pay

## 2016-05-11 ENCOUNTER — Telehealth: Payer: Self-pay | Admitting: Physician Assistant

## 2016-05-11 DIAGNOSIS — R05 Cough: Secondary | ICD-10-CM | POA: Diagnosis not present

## 2016-05-11 DIAGNOSIS — R059 Cough, unspecified: Secondary | ICD-10-CM

## 2016-05-11 DIAGNOSIS — R079 Chest pain, unspecified: Secondary | ICD-10-CM | POA: Diagnosis not present

## 2016-05-11 NOTE — Telephone Encounter (Signed)
Pt was seen on 05/06/16 with Danielle Ortega. Pt stated that the amoxicillin was sent to mail order pharmacy and she called Saturday and azithromycin (ZITHROMAX) 250 MG tablet was sent to pharmacy. Pt stated that she has been taking the medication as directed and she is still having pain in her chest front and back. Pt stated that she would like to go ahead and get a chest Xray. Pt stated that she hasn't received the amoxicillin from the mail order pharmacy. Please advise. Thanks TNP

## 2016-05-11 NOTE — Telephone Encounter (Signed)
Do apologize for mix up. Have ordered CXR. Patient may get this on walk in basis at [facility]. Will advise further upon results of CXR.

## 2016-05-11 NOTE — Telephone Encounter (Signed)
Pt advised.  She will get her chest X-ray today.   Thanks,   -Mickel Baas

## 2016-05-11 NOTE — Telephone Encounter (Signed)
Pt advised and agrees with treatment plan. Aymen Widrig Drozdowski, CMA  

## 2016-05-11 NOTE — Telephone Encounter (Signed)
-----   Message from Trinna Post, [place] sent at 05/11/2016  1:35 PM EDT ----- CXR normal. If augmentin has come in mail, she can discontinue Z-pack and start course of augmentin.

## 2016-05-18 ENCOUNTER — Telehealth: Payer: Self-pay | Admitting: Physician Assistant

## 2016-05-18 NOTE — Telephone Encounter (Signed)
Fabio Bering do you want to change the Rx? ED

## 2016-05-18 NOTE — Telephone Encounter (Signed)
Pt saw Adriana 05/06/16 & 05/11/16. Pt stated that she has been taking amoxicillin-clavulanate (AUGMENTIN) 875-125 MG tablet and for the last 4 days she has had water diarrhea and she is very fatigued b/c of the diarrhea. Pt wanted to know if she should continue taking the medication b/c she has 4 days left or if she can try a different medication. Pharmacy: [facility] Please advise. Thanks TNP

## 2016-05-18 NOTE — Telephone Encounter (Signed)
Advised  ED 

## 2016-05-18 NOTE — Telephone Encounter (Signed)
If patient feels better respiratory wise, between z-pack and augmentin, she has been covered for respiratory pathogen and she can discontinue drug. No fevers or anything? Diarrhea should resolve shortly, call back if not. Thanks.

## 2016-05-22 DIAGNOSIS — R197 Diarrhea, unspecified: Secondary | ICD-10-CM | POA: Diagnosis not present

## 2016-10-12 ENCOUNTER — Other Ambulatory Visit: Payer: Self-pay | Admitting: Physician Assistant

## 2016-10-12 DIAGNOSIS — I10 Essential (primary) hypertension: Secondary | ICD-10-CM

## 2016-10-29 ENCOUNTER — Other Ambulatory Visit: Payer: Self-pay | Admitting: Physician Assistant

## 2016-10-29 DIAGNOSIS — K2100 Gastro-esophageal reflux disease with esophagitis, without bleeding: Secondary | ICD-10-CM

## 2016-11-12 ENCOUNTER — Encounter: Payer: 59 | Admitting: Physician Assistant

## 2016-11-15 ENCOUNTER — Encounter: Payer: 59 | Admitting: Physician Assistant

## 2016-11-15 NOTE — Progress Notes (Deleted)
Patient: Danielle Ortega, Female    DOB: 12/23/1955, 61 y.o.   MRN: [id number] Visit Date: 11/15/2016  Today's Provider: Mar Daring, PA-C   No chief complaint on file.  Subjective:    Annual physical exam [name] is a 61 y.o. female who presents today for health maintenance and complete physical. She feels {DESC; WELL/FAIRLY WELL/POORLY:18703}. She reports exercising ***. She reports she is sleeping {DESC; WELL/FAIRLY WELL/POORLY:18703}.  -----------------------------------------------------------------   Review of Systems  Social History      She  reports that she has never smoked. She has never used smokeless tobacco. She reports that she drinks alcohol. She reports that she does not use drugs.       Social History   Social History  . Marital status: Married    Spouse name: N/A  . Number of children: N/A  . Years of education: N/A   Social History Main Topics  . Smoking status: Never Smoker  . Smokeless tobacco: Never Used  . Alcohol use Yes     Comment: Rarely  . Drug use: No  . Sexual activity: Not on file   Other Topics Concern  . Not on file   Social History Narrative  . No narrative on file    No past medical history on file.   Patient Active Problem List   Diagnosis Date Noted  . Serum calcium elevated 08/20/2015  . Plantar fasciitis 05/28/2015  . 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 Essential hypertension, benign 03/24/2015  . Insomnia 10/27/2007  . 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 Esophagitis, reflux 05/31/2007    Past Surgical History:  Procedure Laterality Date  . SHOULDER ARTHROSCOPY Left   . TONSILLECTOMY      Family History        Family Status  Relation Status  . Mother Deceased  . Father Alive  . Brother Alive        Her family history includes Hypertension in her father; Lung cancer in her mother.     No Known Allergies   Current Outpatient Prescriptions:  .   amoxicillin-clavulanate (AUGMENTIN) 875-125 MG tablet, Take 1 tablet by mouth 2 (two) times daily., Disp: 20 tablet, Rfl: 0 .  Ascorbic Acid (VITAMIN C) 1000 MG tablet, Take by mouth., Disp: , Rfl:  .  aspirin 325 MG tablet, Take by mouth., Disp: , Rfl:  .  azithromycin (ZITHROMAX) 250 MG tablet, As directed, Disp: 6 tablet, Rfl: 0 .  clonazePAM (KLONOPIN) 1 MG tablet, 1/2 - 1 at bedtime for insomnia., Disp: 90 tablet, Rfl: 1 .  Cranberry-Vitamin C-Vitamin E (CRANBERRY PLUS VITAMIN C) 4200-20-3 MG-MG-UNIT CAPS, Take by mouth., Disp: , Rfl:  .  Digestive Enzymes (DIGESTIVE ENZYME PO), Take by mouth., Disp: , Rfl:  .  fluticasone (FLONASE) 50 MCG/ACT nasal spray, Place 2 sprays into both nostrils daily., Disp: 16 g, Rfl: 6 .  FLUVIRIN 0.5 ML SUSY, ADM 0.5ML IM UTD, Disp: , Rfl: 0 .  HYDROcodone-homatropine (HYCODAN) 5-1.5 MG/5ML syrup, Take 5 mLs by mouth at bedtime as needed for cough., Disp: 120 mL, Rfl: 0 .  Krill Oil 1000 MG CAPS, Take by mouth., Disp: , Rfl:  .  Misc Natural Products (GLUCOSAMINE CHONDROITIN COMPLX) TABS, Take by mouth., Disp: , Rfl:  .  omeprazole (PRILOSEC) 20 MG capsule, TAKE 1 CAPSULE BY MOUTH  DAILY, Disp: 90 capsule, Rfl: 1 .  OVER THE COUNTER MEDICATION, , Disp: , Rfl:  .  Probiotic CAPS, Take by mouth., Disp: , Rfl:  .  triamterene-hydrochlorothiazide (IPJASNK-53) 37.5-25 MG tablet, TAKE 1 TABLET BY MOUTH  DAILY, Disp: 90 tablet, Rfl: 1   Patient Care Team: Rubye Beach as PCP - General (Family Medicine)      Objective:   Vitals: There were no vitals taken for this visit.     Physical Exam  Constitutional: She is oriented to person, place, and time. She appears well-developed and well-nourished.  HENT:  Head: Normocephalic and atraumatic.  Right Ear: External ear normal.  Left Ear: External ear normal.  Nose: Nose normal.  Mouth/Throat: Oropharynx is clear and moist.  Eyes: Pupils are equal, round, and reactive to light. Conjunctivae and EOM  are normal.  Neck: Normal range of motion. Neck supple.  Cardiovascular: Normal rate, regular rhythm, normal heart sounds and intact distal pulses.   Pulmonary/Chest: Effort normal and breath sounds normal.  Abdominal: Soft. Bowel sounds are normal.  Musculoskeletal: Normal range of motion.  Neurological: She is alert and oriented to person, place, and time. She has normal reflexes.  Skin: Skin is warm and dry.  Psychiatric: She has a normal mood and affect. Her behavior is normal. Judgment and thought content normal.     Depression Screen No flowsheet data found.    Assessment & Plan:     Routine Health Maintenance and Physical Exam  Exercise Activities and Dietary recommendations Goals    None       There is no immunization history on file for this patient.  Health Maintenance  Topic Date Due  . Hepatitis C Screening  1955-11-17  . HIV Screening  05/16/1968  . TETANUS/TDAP  05/16/1972  . PAP SMEAR  05/17/1974  . MAMMOGRAM  05/17/2003  . COLONOSCOPY  05/17/2003  . INFLUENZA VACCINE  09/22/2016     Discussed health benefits of physical activity, and encouraged her to engage in regular exercise appropriate for her age and condition.    --------------------------------------------------------------------    Mar Daring, PA-C  [place]

## 2016-12-06 ENCOUNTER — Encounter: Payer: Self-pay | Admitting: Physician Assistant

## 2016-12-27 ENCOUNTER — Encounter: Payer: Self-pay | Admitting: Family Medicine

## 2016-12-27 ENCOUNTER — Ambulatory Visit (INDEPENDENT_AMBULATORY_CARE_PROVIDER_SITE_OTHER): Payer: 59 | Admitting: Family Medicine

## 2016-12-27 VITALS — BP 116/82 | HR 64 | Temp 98.6°F | Resp 16 | Ht 60.0 in | Wt 134.0 lb

## 2016-12-27 DIAGNOSIS — K2100 Gastro-esophageal reflux disease with esophagitis, without bleeding: Secondary | ICD-10-CM

## 2016-12-27 DIAGNOSIS — I10 Essential (primary) hypertension: Secondary | ICD-10-CM | POA: Diagnosis not present

## 2016-12-27 DIAGNOSIS — Z23 Encounter for immunization: Secondary | ICD-10-CM

## 2016-12-27 DIAGNOSIS — Z713 Dietary counseling and surveillance: Secondary | ICD-10-CM | POA: Insufficient documentation

## 2016-12-27 DIAGNOSIS — Z0000 Encounter for general adult medical examination without abnormal findings: Secondary | ICD-10-CM | POA: Diagnosis not present

## 2016-12-27 DIAGNOSIS — Z1231 Encounter for screening mammogram for malignant neoplasm of breast: Secondary | ICD-10-CM | POA: Diagnosis not present

## 2016-12-27 DIAGNOSIS — G4700 Insomnia, unspecified: Secondary | ICD-10-CM

## 2016-12-27 DIAGNOSIS — F4321 Adjustment disorder with depressed mood: Secondary | ICD-10-CM

## 2016-12-27 DIAGNOSIS — Z1239 Encounter for other screening for malignant neoplasm of breast: Secondary | ICD-10-CM

## 2016-12-27 DIAGNOSIS — E663 Overweight: Secondary | ICD-10-CM | POA: Diagnosis not present

## 2016-12-27 DIAGNOSIS — Z124 Encounter for screening for malignant neoplasm of cervix: Secondary | ICD-10-CM | POA: Diagnosis not present

## 2016-12-27 MED ORDER — CLONAZEPAM 1 MG PO TABS: ORAL_TABLET | ORAL | 3 refills | Status: DC

## 2016-12-27 MED ORDER — OMEPRAZOLE 20 MG PO CPDR: 20.0000 mg | DELAYED_RELEASE_CAPSULE | Freq: Every day | ORAL | 3 refills | Status: DC

## 2016-12-27 MED ORDER — TRIAMTERENE-HCTZ 37.5-25 MG PO CAPS: 1.0000 | ORAL_CAPSULE | Freq: Every day | ORAL | 2 refills | Status: DC

## 2016-12-27 NOTE — Progress Notes (Signed)
Patient: Danielle Ortega, Female    DOB: 1955/10/04, 61 y.o.   MRN: [id number] Visit Date: 12/27/2016  Today's Provider: Lavon Paganini, MD   Chief Complaint  Patient presents with  . Annual Exam   Subjective:    Annual physical exam Danielle Ortega is a 61 y.o. female who presents today for health maintenance and complete physical. She feels fairly well. She reports she is not exercising. She states wants to lose weight, but wants to lose weight with controlling calorie intake only. States she is only eating lunch, and is eating more than 1000 calorie per day. She reports she is sleeping fairly well. Sleep is worsening after father and pet passed away in the last 1-2 months.  Last colonoscopy- 12/21/2007- hyperplastic polyp. Repeat 10 yrs Last pap at age 74, never abnormal.  Thought that she never needed one again Due for mammogram. -----------------------------------------------------------------   Review of Systems  Constitutional: Positive for fatigue. Negative for activity change, appetite change, chills, diaphoresis, fever and unexpected weight change.  HENT: Positive for hearing loss. Negative for congestion, dental problem, drooling, ear discharge, ear pain, facial swelling, mouth sores, nosebleeds, postnasal drip, rhinorrhea, sinus pressure, sinus pain, sneezing, sore throat, tinnitus, trouble swallowing and voice change.   Eyes: Negative.   Respiratory: Negative.   Cardiovascular: Negative.   Gastrointestinal: Positive for rectal pain. Negative for abdominal distention, abdominal pain, anal bleeding, blood in stool, constipation, diarrhea, nausea and vomiting.  Endocrine: Negative.   Genitourinary: Negative.   Musculoskeletal: Negative.   Skin: Negative.   Allergic/Immunologic: Negative.   Neurological: Negative.   Hematological: Negative.   Psychiatric/Behavioral: Negative.     Social History      She  reports that  has never smoked. she has never used  smokeless tobacco. She reports that she drinks about 1.2 oz of alcohol per week. She reports that she does not use drugs.       Social History   Socioeconomic History  . Marital status: Married    Spouse name: [name]  . Number of children: 0  . Years of education: 75  . Highest education level: Not on file  Social Needs  . Financial resource strain: Not on file  . Food insecurity - worry: Not on file  . Food insecurity - inability: Not on file  . Transportation needs - medical: Not on file  . Transportation needs - non-medical: Not on file  Occupational History  . Not on file  Tobacco Use  . Smoking status: Never Smoker  . Smokeless tobacco: Never Used  Substance and Sexual Activity  . Alcohol use: Yes    Alcohol/week: 1.2 oz    Types: 2 Shots of liquor per week  . Drug use: No  . Sexual activity: Not Currently  Other Topics Concern  . Not on file  Social History Narrative  . Not on file    No past medical history on file.   Patient Active Problem List   Diagnosis Date Noted  . Serum calcium elevated 08/20/2015  . Plantar fasciitis 05/28/2015  . 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 Essential hypertension, benign 03/24/2015  . Insomnia 10/27/2007  . 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 Esophagitis, reflux 05/31/2007    Past Surgical History:  Procedure Laterality Date  . SHOULDER ARTHROSCOPY Left   . TONSILLECTOMY      Family History        Family Status  Relation Name Status  .  Mother  Deceased  . Father  Deceased  . Brother  Alive        Her family history includes Heart attack in her father; Hypertension in her father; Lung cancer in her mother.     Allergies  Allergen Reactions  . Hydrocodone Other (See Comments)    Dizziness and ineffective     Current Outpatient Medications:  .  Ascorbic Acid (VITAMIN C) 1000 MG tablet, Take by mouth., Disp: , Rfl:  .  aspirin 325 MG tablet, Take by mouth., Disp: , Rfl:  .   b complex vitamins tablet, Take 1 tablet daily by mouth., Disp: , Rfl:  .  cetirizine (ZYRTEC) 10 MG tablet, Take 10 mg daily by mouth., Disp: , Rfl:  .  Cholecalciferol (VITAMIN D PO), Take 20,000 Units daily by mouth., Disp: , Rfl:  .  clonazePAM (KLONOPIN) 1 MG tablet, 1/2 - 1 at bedtime for insomnia., Disp: 90 tablet, Rfl: 1 .  Cranberry-Vitamin C-Vitamin E (CRANBERRY PLUS VITAMIN C) 4200-20-3 MG-MG-UNIT CAPS, Take by mouth., Disp: , Rfl:  .  Krill Oil 1000 MG CAPS, Take by mouth., Disp: , Rfl:  .  omeprazole (PRILOSEC) 20 MG capsule, TAKE 1 CAPSULE BY MOUTH  DAILY, Disp: 90 capsule, Rfl: 1 .  triamterene-hydrochlorothiazide (MAXZIDE-25) 37.5-25 MG tablet, TAKE 1 TABLET BY MOUTH  DAILY, Disp: 90 tablet, Rfl: 1 .  VITAMIN E PO, Take 16,000 Units daily by mouth., Disp: , Rfl:  .  Digestive Enzymes (DIGESTIVE ENZYME PO), Take by mouth., Disp: , Rfl:  .  fluticasone (FLONASE) 50 MCG/ACT nasal spray, Place 2 sprays into both nostrils daily., Disp: 16 g, Rfl: 6 .  HYDROcodone-homatropine (HYCODAN) 5-1.5 MG/5ML syrup, Take 5 mLs by mouth at bedtime as needed for cough., Disp: 120 mL, Rfl: 0 .  Misc Natural Products (GLUCOSAMINE CHONDROITIN COMPLX) TABS, Take by mouth., Disp: , Rfl:  .  OVER THE COUNTER MEDICATION, , Disp: , Rfl:  .  Probiotic CAPS, Take by mouth., Disp: , Rfl:    Patient Care Team: Mar Daring, PA-C as PCP - General (Family Medicine)      Objective:   Vitals: BP 116/82 (BP Location: Left Arm, Patient Position: Sitting, Cuff Size: Normal)   Pulse 64   Temp 98.6 F (37 C) (Oral)   Resp 16   Ht 5' (1.524 m)   Wt 134 lb (60.8 kg)   BMI 26.17 kg/m    Vitals:   12/27/16 1026  BP: 116/82  Pulse: 64  Resp: 16  Temp: 98.6 F (37 C)  TempSrc: Oral  Weight: 134 lb (60.8 kg)  Height: 5' (1.524 m)     Physical Exam  Constitutional: She is oriented to person, place, and time. She appears well-developed and well-nourished. No distress.  HENT:  Head:  Normocephalic and atraumatic.  Right Ear: External ear normal.  Left Ear: External ear normal.  Nose: Nose normal.  Mouth/Throat: Oropharynx is clear and moist.  Eyes: Conjunctivae are normal. Pupils are equal, round, and reactive to light.  Neck: Neck supple. No thyromegaly present.  Cardiovascular: Normal rate, regular rhythm, normal heart sounds and intact distal pulses.  No murmur heard. Pulmonary/Chest: Effort normal and breath sounds normal. No respiratory distress. She has no wheezes. She has no rales.  Breasts: breasts appear normal, no suspicious masses, no skin or nipple changes or axillary nodes.  Abdominal: Soft. Bowel sounds are normal. She exhibits no distension. There is no tenderness. There is no rebound and no guarding.  Genitourinary:  Genitourinary Comments: GYN:  External genitalia within normal limits.  Vaginal mucosa pink, moist, normal rugae.  Nonfriable cervix without lesions, no discharge or bleeding noted on speculum exam.      Musculoskeletal: She exhibits no edema or deformity.  Lymphadenopathy:    She has no cervical adenopathy.  Neurological: She is alert and oriented to person, place, and time.  Skin: Skin is warm and dry. No rash noted.  Psychiatric: She has a normal mood and affect. Her behavior is normal.  Vitals reviewed.    Depression Screen PHQ 2/9 Scores 12/27/2016  PHQ - 2 Score 2  PHQ- 9 Score 5      Assessment & Plan:     Routine Health Maintenance and Physical Exam  Exercise Activities and Dietary recommendations Goals    None      Immunization History  Administered Date(s) Administered  . Influenza,inj,Quad PF,6+ Mos 12/27/2016    Health Maintenance  Topic Date Due  . Hepatitis C Screening  Nov 16, 1955  . HIV Screening  05/16/1968  . TETANUS/TDAP  05/16/1972  . PAP SMEAR  05/17/1974  . MAMMOGRAM  05/17/2003  . INFLUENZA VACCINE  09/22/2016  . COLONOSCOPY  12/20/2017     Discussed health benefits of physical activity,  and encouraged her to engage in regular exercise appropriate for her age and condition.    Problem List Items Addressed This Visit      Cardiovascular and Mediastinum   Essential hypertension, benign    Well controlled Continue Maxzide - switched to capsules per patient request F/u in 1 yr Check CMP      Relevant Medications   triamterene-hydrochlorothiazide (DYAZIDE) 37.5-25 MG capsule     Digestive   Esophagitis, reflux    Continue omeprazole      Relevant Medications   omeprazole (PRILOSEC) 20 MG capsule     Other   Insomnia    Continue klonopin prn      Relevant Medications   clonazePAM (KLONOPIN) 1 MG tablet   Adaptation reaction    Some depressive symptoms in light of recent passing of her father and brother's illness Discussed therapy - not interested at this time No indication for medication at this time      Overweight (BMI 25.0-29.9)    Discussed healthy weight, macro nutrients, intermittent fasting, exercise       Other Visit Diagnoses    Healthcare maintenance    -  Primary   Relevant Orders   VITAMIN D 25 Hydroxy (Vit-D Deficiency, Fractures)   Lipid panel   HIV antibody (with reflex)   Hepatitis C Antibody   Comprehensive metabolic panel   CBC w/Diff/Platelet   TSH   T4, free   Flu vaccine need       Relevant Orders   Flu Vaccine QUAD 36+ mos IM (Completed)   Screening for cervical cancer       Relevant Orders   Pap liquid-based and HPV (high risk)   Breast cancer screening       Relevant Orders   MM DIGITAL SCREENING BILATERAL      -------------------------------------------------------------------- Return in about 1 year (around 12/27/2017) for CPE.   The entirety of the information documented in the History of Present Illness, Review of Systems and Physical Exam were personally obtained by me. Portions of this information were initially documented by Raquel Sarna Ratchford, CMA and reviewed by me for thoroughness and accuracy.      Lavon Paganini, MD  [facility]

## 2016-12-27 NOTE — Assessment & Plan Note (Signed)
Some depressive symptoms in light of recent passing of her father and brother's illness Discussed therapy - not interested at this time No indication for medication at this time

## 2016-12-27 NOTE — Assessment & Plan Note (Signed)
Continue klonopin prn 

## 2016-12-27 NOTE — Assessment & Plan Note (Signed)
Well controlled Continue Maxzide - switched to capsules per patient request F/u in 1 yr Check CMP

## 2016-12-27 NOTE — Assessment & Plan Note (Signed)
Discussed healthy weight, macro nutrients, intermittent fasting, exercise

## 2016-12-27 NOTE — Assessment & Plan Note (Signed)
Continue omeprazole 

## 2016-12-27 NOTE — Patient Instructions (Addendum)
Read the Obesity Code   Preventive Care 40-64 Years, Female Preventive care refers to lifestyle choices and visits with your health care provider that can promote health and wellness. What does preventive care include?  A yearly physical exam. This is also called an annual well check.  Dental exams once or twice a year.  Routine eye exams. Ask your health care provider how often you should have your eyes checked.  Personal lifestyle choices, including: ? Daily care of your teeth and gums. ? Regular physical activity. ? Eating a healthy diet. ? Avoiding tobacco and drug use. ? Limiting alcohol use. ? Practicing safe sex. ? Taking low-dose aspirin daily starting at age 81. ? Taking vitamin and mineral supplements as recommended by your health care provider. What happens during an annual well check? The services and screenings done by your health care provider during your annual well check will depend on your age, overall health, lifestyle risk factors, and family history of disease. Counseling Your health care provider may ask you questions about your:  Alcohol use.  Tobacco use.  Drug use.  Emotional well-being.  Home and relationship well-being.  Sexual activity.  Eating habits.  Work and work Statistician.  Method of birth control.  Menstrual cycle.  Pregnancy history.  Screening You may have the following tests or measurements:  Height, weight, and BMI.  Blood pressure.  Lipid and cholesterol levels. These may be checked every 5 years, or more frequently if you are over 73 years old.  Skin check.  Lung cancer screening. You may have this screening every year starting at age 43 if you have a 30-pack-year history of smoking and currently smoke or have quit within the past 15 years.  Fecal occult blood test (FOBT) of the stool. You may have this test every year starting at age 34.  Flexible sigmoidoscopy or colonoscopy. You may have a sigmoidoscopy every  5 years or a colonoscopy every 10 years starting at age 59.  Hepatitis C blood test.  Hepatitis B blood test.  Sexually transmitted disease (STD) testing.  Diabetes screening. This is done by checking your blood sugar (glucose) after you have not eaten for a while (fasting). You may have this done every 1-3 years.  Mammogram. This may be done every 1-2 years. Talk to your health care provider about when you should start having regular mammograms. This may depend on whether you have a family history of breast cancer.  BRCA-related cancer screening. This may be done if you have a family history of breast, ovarian, tubal, or peritoneal cancers.  Pelvic exam and Pap test. This may be done every 3 years starting at age 36. Starting at age 59, this may be done every 5 years if you have a Pap test in combination with an HPV test.  Bone density scan. This is done to screen for osteoporosis. You may have this scan if you are at high risk for osteoporosis.  Discuss your test results, treatment options, and if necessary, the need for more tests with your health care provider. Vaccines Your health care provider may recommend certain vaccines, such as:  Influenza vaccine. This is recommended every year.  Tetanus, diphtheria, and acellular pertussis (Tdap, Td) vaccine. You may need a Td booster every 10 years.  Varicella vaccine. You may need this if you have not been vaccinated.  Zoster vaccine. You may need this after age 69.  Measles, mumps, and rubella (MMR) vaccine. You may need at least one dose of  MMR if you were born in 1957 or later. You may also need a second dose.  Pneumococcal 13-valent conjugate (PCV13) vaccine. You may need this if you have certain conditions and were not previously vaccinated.  Pneumococcal polysaccharide (PPSV23) vaccine. You may need one or two doses if you smoke cigarettes or if you have certain conditions.  Meningococcal vaccine. You may need this if you have  certain conditions.  Hepatitis A vaccine. You may need this if you have certain conditions or if you travel or work in places where you may be exposed to hepatitis A.  Hepatitis B vaccine. You may need this if you have certain conditions or if you travel or work in places where you may be exposed to hepatitis B.  Haemophilus influenzae type b (Hib) vaccine. You may need this if you have certain conditions.  Talk to your health care provider about which screenings and vaccines you need and how often you need them. This information is not intended to replace advice given to you by your health care provider. Make sure you discuss any questions you have with your health care provider. Document Released: 03/07/2015 Document Revised: 10/29/2015 Document Reviewed: 12/10/2014 Elsevier Interactive Patient Education  2017 [employer].

## 2016-12-29 DIAGNOSIS — Z0000 Encounter for general adult medical examination without abnormal findings: Secondary | ICD-10-CM | POA: Diagnosis not present

## 2016-12-30 ENCOUNTER — Telehealth: Payer: Self-pay

## 2016-12-30 LAB — COMPREHENSIVE METABOLIC PANEL
ALT: 20 IU/L (ref 0–32)
AST: 20 IU/L (ref 0–40)
Albumin/Globulin Ratio: 2.4 — ABNORMAL HIGH (ref 1.2–2.2)
Albumin: 4.8 g/dL (ref 3.6–4.8)
Alkaline Phosphatase: 53 IU/L (ref 39–117)
BUN/Creatinine Ratio: 15 (ref 12–28)
BUN: 14 mg/dL (ref 8–27)
Bilirubin Total: 0.5 mg/dL (ref 0.0–1.2)
CO2: 26 mmol/L (ref 20–29)
Calcium: 10.4 mg/dL — ABNORMAL HIGH (ref 8.7–10.3)
Chloride: 100 mmol/L (ref 96–106)
Creatinine, Ser: 0.94 mg/dL (ref 0.57–1.00)
GFR calc Af Amer: 75 mL/min/{1.73_m2} (ref >59)
GFR calc non Af Amer: 65 mL/min/{1.73_m2} (ref >59)
Globulin, Total: 2 g/dL (ref 1.5–4.5)
Glucose: 88 mg/dL (ref 65–99)
Potassium: 4.3 mmol/L (ref 3.5–5.2)
Sodium: 142 mmol/L (ref 134–144)
Total Protein: 6.8 g/dL (ref 6.0–8.5)

## 2016-12-30 LAB — LIPID PANEL
Chol/HDL Ratio: 2.3 ratio (ref 0.0–4.4)
Cholesterol, Total: 184 mg/dL (ref 100–199)
HDL: 80 mg/dL (ref >39)
LDL Calculated: 86 mg/dL (ref 0–99)
Triglycerides: 91 mg/dL (ref 0–149)
VLDL Cholesterol Cal: 18 mg/dL (ref 5–40)

## 2016-12-30 LAB — CBC WITH DIFFERENTIAL/PLATELET
Basophils Absolute: 0 10*3/uL (ref 0.0–0.2)
Basos: 0 %
EOS (ABSOLUTE): 0.1 10*3/uL (ref 0.0–0.4)
Eos: 3 %
Hematocrit: 43.4 % (ref 34.0–46.6)
Hemoglobin: 14.7 g/dL (ref 11.1–15.9)
Immature Grans (Abs): 0 10*3/uL (ref 0.0–0.1)
Immature Granulocytes: 0 %
Lymphocytes Absolute: 1.2 10*3/uL (ref 0.7–3.1)
Lymphs: 30 %
MCH: 30.4 pg (ref 26.6–33.0)
MCHC: 33.9 g/dL (ref 31.5–35.7)
MCV: 90 fL (ref 79–97)
Monocytes Absolute: 0.3 10*3/uL (ref 0.1–0.9)
Monocytes: 7 %
Neutrophils Absolute: 2.4 10*3/uL (ref 1.4–7.0)
Neutrophils: 60 %
Platelets: 174 10*3/uL (ref 150–379)
RBC: 4.83 x10E6/uL (ref 3.77–5.28)
RDW: 14.1 % (ref 12.3–15.4)
WBC: 4 10*3/uL (ref 3.4–10.8)

## 2016-12-30 LAB — HIV ANTIBODY (ROUTINE TESTING W REFLEX): HIV Screen 4th Generation wRfx: NONREACTIVE

## 2016-12-30 LAB — TSH: TSH: 3.22 u[IU]/mL (ref 0.450–4.500)

## 2016-12-30 LAB — T4, FREE: Free T4: 1.15 ng/dL (ref 0.82–1.77)

## 2016-12-30 LAB — HEPATITIS C ANTIBODY: Hep C Virus Ab: <0.1 s/co ratio (ref 0.0–0.9)

## 2016-12-30 LAB — VITAMIN D 25 HYDROXY (VIT D DEFICIENCY, FRACTURES): Vit D, 25-Hydroxy: 184.4 ng/mL — ABNORMAL HIGH (ref 30.0–100.0)

## 2016-12-30 NOTE — Telephone Encounter (Signed)
-----   Message from Virginia Crews, MD sent at 12/30/2016  3:00 PM EST ----- Vit D level is quite high.  Decrease supplementation as we discussed.   Would also recommend stopping supplements for at least 2 months before resuming.  Cholesterol is good.  Negative HIV, Hep C screening.  Normal kidney function, liver function, electrolytes, except Ca is slightly high.  If taking supplements decrease intake.  Normal Blood counts, Thyroid function.  Pap smear result pending.  Virginia Crews, MD, MPH [facility] 12/30/2016 3:00 PM

## 2016-12-30 NOTE — Telephone Encounter (Signed)
Pt advised of results and agrees with treatment plan. 

## 2016-12-31 ENCOUNTER — Encounter: Payer: Self-pay | Admitting: Family Medicine

## 2016-12-31 LAB — PAP LB AND HPV HIGH-RISK
HPV, high-risk: NEGATIVE
PAP Smear Comment: 0

## 2017-02-04 ENCOUNTER — Ambulatory Visit: Payer: 59 | Admitting: Family Medicine

## 2017-02-04 ENCOUNTER — Other Ambulatory Visit: Payer: Self-pay

## 2017-02-04 VITALS — BP 118/80 | HR 64 | Temp 98.4°F | Resp 16 | Wt 127.0 lb

## 2017-02-04 DIAGNOSIS — E663 Overweight: Secondary | ICD-10-CM | POA: Diagnosis not present

## 2017-02-04 NOTE — Assessment & Plan Note (Signed)
Congratulated patient on crossing over to normal BMI She is doing well with intermittent fasting (Eating 1 meal/day) after reading the Obesity Code Her wife was concerned regarding her mood around food and weight loss/gain, but she seems to be coping well and knows her limits Goal weight 110 which is an acceptable BMI F/u in 3 months

## 2017-02-04 NOTE — Progress Notes (Signed)
Patient: Danielle Ortega Female    DOB: [birth year]   61 y.o.   MRN: [id number] Visit Date: 02/04/2017  Today's Provider: Lavon Paganini, MD   Chief Complaint  Patient presents with  . Weight Loss   Subjective:    HPI    Overweight: Patient complains of being overweight. Patient cites health, increased physical ability as reasons for wanting to lose weight. Pt states [employer]'s insurance penalizes employees who are obese.  Overweight History Weight in late teens: 93 lbs. Period of greatest weight gain: 45 lbs since turning 28. Highest adult weight: 150 lb  Current Exercise Habits none  Current Eating Habits Number of regular meals per day: 1 and a half. States she was eating only 1 meal per day, which helped her get down to 122 lb. She states Thanksgiving and Christmas cookies helped her gain some weight back. Number of snacking episodes per day: 0 Binge behavior?: no Purge behavior? no Anorexic behavior? no Eating precipitated by stress? yes - "sometimes". Guilt feelings associated with eating? yes - states "there are days when I punish myself and tell myself I won't even eat one meal per day".  Other Potential Contributing Factors Use of alcohol: average 0-2 drinks/week Use of medications that may cause weight gain none  Wt Readings from Last 3 Encounters:  02/04/17 127 lb (57.6 kg)  12/27/16 134 lb (60.8 kg)  05/06/16 134 lb (60.8 kg)    Allergies  Allergen Reactions  . Hydrocodone Other (See Comments)    Dizziness and ineffective     Current Outpatient Medications:  .  Ascorbic Acid (VITAMIN C) 1000 MG tablet, Take by mouth., Disp: , Rfl:  .  aspirin 325 MG tablet, Take by mouth., Disp: , Rfl:  .  b complex vitamins tablet, Take 1 tablet daily by mouth., Disp: , Rfl:  .  cetirizine (ZYRTEC) 10 MG tablet, Take 10 mg daily by mouth., Disp: , Rfl:  .  clonazePAM (KLONOPIN) 1 MG tablet, 1/2 - 1 at bedtime for insomnia., Disp: 90 tablet, Rfl: 3 .   Cranberry-Vitamin C-Vitamin E (CRANBERRY PLUS VITAMIN C) 4200-20-3 MG-MG-UNIT CAPS, Take by mouth., Disp: , Rfl:  .  Krill Oil 1000 MG CAPS, Take by mouth., Disp: , Rfl:  .  omeprazole (PRILOSEC) 20 MG capsule, Take 1 capsule (20 mg total) daily by mouth., Disp: 90 capsule, Rfl: 3 .  triamterene-hydrochlorothiazide (DYAZIDE) 37.5-25 MG capsule, Take 1 each (1 capsule total) daily by mouth., Disp: 90 capsule, Rfl: 2 .  VITAMIN E PO, Take 16,000 Units daily by mouth., Disp: , Rfl:   Review of Systems  Constitutional: Negative for activity change, appetite change, chills, diaphoresis, fatigue, fever and unexpected weight change.  Respiratory: Negative for cough.   Cardiovascular: Negative for chest pain, palpitations and leg swelling.    Social History   Tobacco Use  . Smoking status: Never Smoker  . Smokeless tobacco: Never Used  Substance Use Topics  . Alcohol use: Yes    Alcohol/week: 1.2 oz    Types: 2 Shots of liquor per week   Objective:   BP 118/80 (BP Location: Left Arm, Patient Position: Sitting, Cuff Size: Normal)   Pulse 64   Temp 98.4 F (36.9 C) (Oral)   Resp 16   Wt 127 lb (57.6 kg)   BMI 24.80 kg/m  Vitals:   02/04/17 1337  BP: 118/80  Pulse: 64  Resp: 16  Temp: 98.4 F (36.9 C)  TempSrc: Oral  Weight: 127 lb (57.6 kg)     Physical Exam  Constitutional: She is oriented to person, place, and time. She appears well-developed and well-nourished. No distress.  Cardiovascular: Normal rate, regular rhythm, normal heart sounds and intact distal pulses.  No murmur heard. Pulmonary/Chest: Effort normal and breath sounds normal. No respiratory distress. She has no wheezes. She has no rales.  Musculoskeletal: She exhibits no edema.  Neurological: She is alert and oriented to person, place, and time.  Psychiatric: She has a normal mood and affect. Her behavior is normal.  Vitals reviewed.       Assessment & Plan:      Problem List Items Addressed This Visit        Other   Overweight (BMI 25.0-29.9) - Primary    Congratulated patient on crossing over to normal BMI She is doing well with intermittent fasting (Eating 1 meal/day) after reading the Obesity Code Her wife was concerned regarding her mood around food and weight loss/gain, but she seems to be coping well and knows her limits Goal weight 110 which is an acceptable BMI F/u in 3 months         Return in about 3 months (around 05/05/2017) for weight f/u.     The entirety of the information documented in the History of Present Illness, Review of Systems and Physical Exam were personally obtained by me. Portions of this information were initially documented by Raquel Sarna Ratchford, CMA and reviewed by me for thoroughness and accuracy.     Lavon Paganini, MD  [facility]

## 2017-02-16 ENCOUNTER — Ambulatory Visit
Admission: RE | Admit: 2017-02-16 | Discharge: 2017-02-16 | Disposition: A | Discharge status: Home / Self Care | Payer: 59 | Source: Ambulatory Visit | Attending: Family Medicine | Admitting: Family Medicine

## 2017-02-16 DIAGNOSIS — R928 Other abnormal and inconclusive findings on diagnostic imaging of breast: Secondary | ICD-10-CM | POA: Insufficient documentation

## 2017-02-16 DIAGNOSIS — Z1239 Encounter for other screening for malignant neoplasm of breast: Secondary | ICD-10-CM

## 2017-02-16 DIAGNOSIS — Z1231 Encounter for screening mammogram for malignant neoplasm of breast: Secondary | ICD-10-CM | POA: Diagnosis not present

## 2017-02-21 ENCOUNTER — Other Ambulatory Visit: Payer: Self-pay | Admitting: Family Medicine

## 2017-02-21 DIAGNOSIS — R928 Other abnormal and inconclusive findings on diagnostic imaging of breast: Secondary | ICD-10-CM

## 2017-02-21 DIAGNOSIS — N6320 Unspecified lump in the left breast, unspecified quadrant: Secondary | ICD-10-CM

## 2017-03-09 ENCOUNTER — Ambulatory Visit
Admission: RE | Admit: 2017-03-09 | Discharge: 2017-03-09 | Disposition: A | Discharge status: Home / Self Care | Payer: Managed Care, Other (non HMO) | Source: Ambulatory Visit | Attending: Family Medicine | Admitting: Family Medicine

## 2017-03-09 DIAGNOSIS — N6320 Unspecified lump in the left breast, unspecified quadrant: Secondary | ICD-10-CM | POA: Diagnosis present

## 2017-03-09 DIAGNOSIS — R928 Other abnormal and inconclusive findings on diagnostic imaging of breast: Secondary | ICD-10-CM | POA: Diagnosis present

## 2017-05-05 ENCOUNTER — Encounter: Payer: Self-pay | Admitting: Family Medicine

## 2017-05-05 ENCOUNTER — Ambulatory Visit: Payer: Managed Care, Other (non HMO) | Admitting: Family Medicine

## 2017-05-05 VITALS — BP 108/78 | HR 70 | Temp 98.5°F | Resp 16 | Wt 121.0 lb

## 2017-05-05 DIAGNOSIS — I10 Essential (primary) hypertension: Secondary | ICD-10-CM | POA: Diagnosis not present

## 2017-05-05 DIAGNOSIS — Z713 Dietary counseling and surveillance: Secondary | ICD-10-CM

## 2017-05-05 MED ORDER — ASPIRIN 81 MG PO TABS: 81.0000 mg | ORAL_TABLET | Freq: Every day | ORAL | 0 refills | Status: AC

## 2017-05-05 NOTE — Assessment & Plan Note (Signed)
Well controlled Continue Dyazide F/u at CPE

## 2017-05-05 NOTE — Assessment & Plan Note (Signed)
Congratulated patient on continued weight loss and maintaining normal BMI Continue intermittent fasting and OMAD Goal weight continues to be 110 lbs F/u in 6 months

## 2017-05-05 NOTE — Progress Notes (Signed)
Patient: Danielle Ortega Female    DOB: 06/10/1955   62 y.o.   MRN: [id number] Visit Date: 05/05/2017  Today's Provider: Lavon Paganini, MD   I, Martha Clan, CMA, am acting as scribe for Lavon Paganini, MD.  Chief Complaint  Patient presents with  . Overweight   Subjective:    HPI     Follow up for Overweight  The patient was last seen for this 3 months ago. Changes made at last visit include encouraging continuance of weight loss using intermittent fasting. Her goal weight is 110 pounds.  She reports good compliance with treatment. She feels that condition is Improved. She states she eating one meal per day, which pt is tolerating well. Coffee for breakfast, regular lunch, maybe small snack at night. Has become accustomed to this and feels well. She would like to start exercising today. She has a weighted book bag that she plans to wear while walking. She will also start playing golf next month.  Wt Readings from Last 3 Encounters:  05/05/17 121 lb (54.9 kg)  02/04/17 127 lb (57.6 kg)  12/27/16 134 lb (60.8 kg)   ------------------------------------------------------------------------------------ HTN: - Medications: Dyazide 37.5-25mg  daily - Compliance: good - Checking BP at home: no - Denies any SOB, CP, vision changes, LE edema, medication SEs, or symptoms of hypotension    Allergies  Allergen Reactions  . Hydrocodone Other (See Comments)    Dizziness and ineffective     Current Outpatient Medications:  .  Ascorbic Acid (VITAMIN C) 1000 MG tablet, Take by mouth., Disp: , Rfl:  .  aspirin 325 MG tablet, Take by mouth., Disp: , Rfl:  .  b complex vitamins tablet, Take 1 tablet daily by mouth., Disp: , Rfl:  .  cetirizine (ZYRTEC) 10 MG tablet, Take 10 mg daily by mouth., Disp: , Rfl:  .  clonazePAM (KLONOPIN) 1 MG tablet, 1/2 - 1 at bedtime for insomnia., Disp: 90 tablet, Rfl: 3 .  Cranberry-Vitamin C-Vitamin E (CRANBERRY PLUS VITAMIN C)  4200-20-3 MG-MG-UNIT CAPS, Take by mouth., Disp: , Rfl:  .  Krill Oil 1000 MG CAPS, Take by mouth., Disp: , Rfl:  .  Multiple Vitamin (MULTIVITAMIN) tablet, Take 1 tablet by mouth daily., Disp: , Rfl:  .  omeprazole (PRILOSEC) 20 MG capsule, Take 1 capsule (20 mg total) daily by mouth., Disp: 90 capsule, Rfl: 3 .  triamterene-hydrochlorothiazide (DYAZIDE) 37.5-25 MG capsule, Take 1 each (1 capsule total) daily by mouth., Disp: 90 capsule, Rfl: 2 .  VITAMIN E PO, Take 16,000 Units daily by mouth., Disp: , Rfl:   Review of Systems  Constitutional: Negative for activity change, appetite change, chills, diaphoresis, fatigue, fever and unexpected weight change.  Respiratory: Negative.  Negative for shortness of breath.   Cardiovascular: Negative for chest pain, palpitations and leg swelling.  Gastrointestinal: Negative.   Genitourinary: Negative.   Musculoskeletal: Negative.   Neurological: Negative.   Psychiatric/Behavioral: Negative.     Social History   Tobacco Use  . Smoking status: Never Smoker  . Smokeless tobacco: Never Used  Substance Use Topics  . Alcohol use: Yes    Alcohol/week: 1.2 oz    Types: 2 Shots of liquor per week   Objective:   BP 108/78 (BP Location: Left Arm, Patient Position: Sitting, Cuff Size: Normal)   Pulse 70   Temp 98.5 F (36.9 C) (Oral)   Resp 16   Wt 121 lb (54.9 kg)   SpO2 99%   BMI  23.63 kg/m  Vitals:   05/05/17 0810  BP: 108/78  Pulse: 70  Resp: 16  Temp: 98.5 F (36.9 C)  TempSrc: Oral  SpO2: 99%  Weight: 121 lb (54.9 kg)     Physical Exam  Constitutional: She is oriented to person, place, and time. She appears well-developed and well-nourished. No distress.  HENT:  Head: Normocephalic and atraumatic.  Eyes: Conjunctivae are normal. No scleral icterus.  Cardiovascular: Normal rate, regular rhythm, normal heart sounds and intact distal pulses.  No murmur heard. Pulmonary/Chest: Effort normal and breath sounds normal. No  respiratory distress. She has no wheezes. She has no rales.  Musculoskeletal: She exhibits no edema.  Neurological: She is alert and oriented to person, place, and time.  Skin: Skin is warm and dry. No rash noted.  Psychiatric: She has a normal mood and affect. Her behavior is normal.  Vitals reviewed.       Assessment & Plan:   Problem List Items Addressed This Visit      Cardiovascular and Mediastinum   Essential hypertension, benign    Well controlled Continue Dyazide F/u at CPE      Relevant Medications   aspirin 81 MG tablet     Other   Weight loss counseling, encounter for - Primary    Congratulated patient on continued weight loss and maintaining normal BMI Continue intermittent fasting and OMAD Goal weight continues to be 110 lbs F/u in 6 months          Return in about 8 months (around 01/05/2018) for CPE.   The entirety of the information documented in the History of Present Illness, Review of Systems and Physical Exam were personally obtained by me. Portions of this information were initially documented by Raquel Sarna Ratchford, CMA and reviewed by me for thoroughness and accuracy.    Virginia Crews, MD, MPH [facility] 05/05/2017 9:33 AM

## 2017-07-20 ENCOUNTER — Other Ambulatory Visit: Payer: Self-pay | Admitting: Family Medicine

## 2017-07-20 DIAGNOSIS — G4700 Insomnia, unspecified: Secondary | ICD-10-CM

## 2017-07-20 MED ORDER — CLONAZEPAM 1 MG PO TABS: ORAL_TABLET | ORAL | 3 refills | Status: DC

## 2017-07-20 NOTE — Addendum Note (Signed)
Addended by: Virginia Crews on: 07/20/2017 03:06 PM   Modules accepted: Orders

## 2017-07-20 NOTE — Telephone Encounter (Signed)
Patient came into office stating she needed to request a  refill on the following medication. Patient use's  [facility] mail service. Thanks CC  clonazePAM (KLONOPIN) 1 MG tablet

## 2017-08-25 ENCOUNTER — Other Ambulatory Visit: Payer: Self-pay | Admitting: Family Medicine

## 2017-09-30 LAB — HEMOGLOBIN A1C: Hemoglobin A1C: 5.5

## 2017-09-30 LAB — LIPID PANEL
Cholesterol: 194 (ref 0–200)
HDL: 79 — AB (ref 35–70)
LDL Cholesterol: 99
Triglycerides: 82 (ref 40–160)

## 2017-09-30 LAB — BASIC METABOLIC PANEL
Creatinine: 0.9 (ref 0.5–1.1)
Glucose: 94

## 2017-11-29 ENCOUNTER — Other Ambulatory Visit: Payer: Self-pay | Admitting: Family Medicine

## 2017-11-29 DIAGNOSIS — K2100 Gastro-esophageal reflux disease with esophagitis, without bleeding: Secondary | ICD-10-CM

## 2017-12-28 ENCOUNTER — Other Ambulatory Visit: Payer: Self-pay

## 2017-12-28 ENCOUNTER — Encounter: Payer: Self-pay | Admitting: Family Medicine

## 2017-12-28 ENCOUNTER — Ambulatory Visit (INDEPENDENT_AMBULATORY_CARE_PROVIDER_SITE_OTHER): Payer: Managed Care, Other (non HMO) | Admitting: Family Medicine

## 2017-12-28 VITALS — BP 118/78 | HR 58 | Temp 98.7°F | Resp 16 | Ht 59.0 in | Wt 121.2 lb

## 2017-12-28 DIAGNOSIS — Z1211 Encounter for screening for malignant neoplasm of colon: Secondary | ICD-10-CM

## 2017-12-28 DIAGNOSIS — K2100 Gastro-esophageal reflux disease with esophagitis, without bleeding: Secondary | ICD-10-CM

## 2017-12-28 DIAGNOSIS — I10 Essential (primary) hypertension: Secondary | ICD-10-CM

## 2017-12-28 DIAGNOSIS — Z0000 Encounter for general adult medical examination without abnormal findings: Secondary | ICD-10-CM

## 2017-12-28 DIAGNOSIS — F5101 Primary insomnia: Secondary | ICD-10-CM

## 2017-12-28 DIAGNOSIS — Z23 Encounter for immunization: Secondary | ICD-10-CM | POA: Diagnosis not present

## 2017-12-28 DIAGNOSIS — G4700 Insomnia, unspecified: Secondary | ICD-10-CM

## 2017-12-28 DIAGNOSIS — R928 Other abnormal and inconclusive findings on diagnostic imaging of breast: Secondary | ICD-10-CM

## 2017-12-28 DIAGNOSIS — Z713 Dietary counseling and surveillance: Secondary | ICD-10-CM

## 2017-12-28 DIAGNOSIS — Z1239 Encounter for other screening for malignant neoplasm of breast: Secondary | ICD-10-CM

## 2017-12-28 MED ORDER — CETIRIZINE HCL 10 MG PO TABS
10.0000 mg | ORAL_TABLET | Freq: Every day | ORAL | 3 refills | Status: DC
Start: 2017-12-28 — End: 2018-10-18

## 2017-12-28 MED ORDER — OMEPRAZOLE 20 MG PO CPDR: DELAYED_RELEASE_CAPSULE | ORAL | 3 refills | Status: DC

## 2017-12-28 MED ORDER — TRIAMTERENE-HCTZ 37.5-25 MG PO CAPS: 1.0000 | ORAL_CAPSULE | Freq: Every day | ORAL | 2 refills | Status: DC

## 2017-12-28 MED ORDER — CLONAZEPAM 1 MG PO TABS: ORAL_TABLET | ORAL | 3 refills | Status: DC

## 2017-12-28 NOTE — Assessment & Plan Note (Signed)
Well controlled Continue Dyazide Will abstract recent labs

## 2017-12-28 NOTE — Patient Instructions (Signed)
Preventive Care 40-64 Years, Female Preventive care refers to lifestyle choices and visits with your health care provider that can promote health and wellness. What does preventive care include?  A yearly physical exam. This is also called an annual well check.  Dental exams once or twice a year.  Routine eye exams. Ask your health care provider how often you should have your eyes checked.  Personal lifestyle choices, including: ? Daily care of your teeth and gums. ? Regular physical activity. ? Eating a healthy diet. ? Avoiding tobacco and drug use. ? Limiting alcohol use. ? Practicing safe sex. ? Taking low-dose aspirin daily starting at age 58. ? Taking vitamin and mineral supplements as recommended by your health care provider. What happens during an annual well check? The services and screenings done by your health care provider during your annual well check will depend on your age, overall health, lifestyle risk factors, and family history of disease. Counseling Your health care provider may ask you questions about your:  Alcohol use.  Tobacco use.  Drug use.  Emotional well-being.  Home and relationship well-being.  Sexual activity.  Eating habits.  Work and work Statistician.  Method of birth control.  Menstrual cycle.  Pregnancy history.  Screening You may have the following tests or measurements:  Height, weight, and BMI.  Blood pressure.  Lipid and cholesterol levels. These may be checked every 5 years, or more frequently if you are over 81 years old.  Skin check.  Lung cancer screening. You may have this screening every year starting at age 78 if you have a 30-pack-year history of smoking and currently smoke or have quit within the past 15 years.  Fecal occult blood test (FOBT) of the stool. You may have this test every year starting at age 65.  Flexible sigmoidoscopy or colonoscopy. You may have a sigmoidoscopy every 5 years or a colonoscopy  every 10 years starting at age 30.  Hepatitis C blood test.  Hepatitis B blood test.  Sexually transmitted disease (STD) testing.  Diabetes screening. This is done by checking your blood sugar (glucose) after you have not eaten for a while (fasting). You may have this done every 1-3 years.  Mammogram. This may be done every 1-2 years. Talk to your health care provider about when you should start having regular mammograms. This may depend on whether you have a family history of breast cancer.  BRCA-related cancer screening. This may be done if you have a family history of breast, ovarian, tubal, or peritoneal cancers.  Pelvic exam and Pap test. This may be done every 3 years starting at age 80. Starting at age 36, this may be done every 5 years if you have a Pap test in combination with an HPV test.  Bone density scan. This is done to screen for osteoporosis. You may have this scan if you are at high risk for osteoporosis.  Discuss your test results, treatment options, and if necessary, the need for more tests with your health care provider. Vaccines Your health care provider may recommend certain vaccines, such as:  Influenza vaccine. This is recommended every year.  Tetanus, diphtheria, and acellular pertussis (Tdap, Td) vaccine. You may need a Td booster every 10 years.  Varicella vaccine. You may need this if you have not been vaccinated.  Zoster vaccine. You may need this after age 5.  Measles, mumps, and rubella (MMR) vaccine. You may need at least one dose of MMR if you were born in  1957 or later. You may also need a second dose.  Pneumococcal 13-valent conjugate (PCV13) vaccine. You may need this if you have certain conditions and were not previously vaccinated.  Pneumococcal polysaccharide (PPSV23) vaccine. You may need one or two doses if you smoke cigarettes or if you have certain conditions.  Meningococcal vaccine. You may need this if you have certain  conditions.  Hepatitis A vaccine. You may need this if you have certain conditions or if you travel or work in places where you may be exposed to hepatitis A.  Hepatitis B vaccine. You may need this if you have certain conditions or if you travel or work in places where you may be exposed to hepatitis B.  Haemophilus influenzae type b (Hib) vaccine. You may need this if you have certain conditions.  Talk to your health care provider about which screenings and vaccines you need and how often you need them. This information is not intended to replace advice given to you by your health care provider. Make sure you discuss any questions you have with your health care provider. Document Released: 03/07/2015 Document Revised: 10/29/2015 Document Reviewed: 12/10/2014 Elsevier Interactive Patient Education  2018 Elsevier Inc.  

## 2017-12-28 NOTE — Assessment & Plan Note (Signed)
Continue klonopin prn

## 2017-12-28 NOTE — Assessment & Plan Note (Signed)
Congratulated on maintaining normal BMI Goal weight remains 110lbs per patient

## 2017-12-28 NOTE — Progress Notes (Signed)
Patient: Danielle Ortega, Female    DOB: 01/12/1956, 62 y.o.   MRN: [id number] Visit Date: 12/28/2017  Today's Provider: Lavon Paganini, MD   Chief Complaint  Patient presents with  . Annual Exam   Subjective:  I, Hurman Horn, CMA, am acting as a scribe for Lavon Paganini, MD.   Annual physical exam Danielle Ortega is a 62 y.o. female who presents today for health maintenance and complete physical. She feels well. She reports exercising none. She reports she is sleeping well and states that the Clonazepam really helps her sleep.  Last colonoscopy 12/21/2007 - 1 polyp. Reepat in 36yrs Last mammogram 01/2018 [facility] 3 - recommended diagnostic L mammogram and [place] in 6 months  Continue to stress about weight loss.  She would like to hit goal weight of 110lbs. She is thinking of starting yoga. -----------------------------------------------------------------   Review of Systems  Constitutional: Positive for fatigue.  HENT: Positive for hearing loss and tinnitus.   Eyes: Positive for discharge and visual disturbance.  Respiratory: Negative.   Cardiovascular: Negative.   Gastrointestinal: Positive for rectal pain.  Endocrine: Negative.   Genitourinary: Negative.   Musculoskeletal: Negative.   Skin: Negative.   Allergic/Immunologic: Negative.   Neurological: Positive for seizures.  Hematological: Negative.   Psychiatric/Behavioral: Negative.     Social History      She  reports that she has never smoked. She has never used smokeless tobacco. She reports that she drinks about 2.0 standard drinks of alcohol per week. She reports that she does not use drugs.       Social History   Socioeconomic History  . Marital status: Married    Spouse name: [name]  . Number of children: 0  . Years of education: 37  . Highest education level: Not on file  Occupational History  . Not on file  Social Needs  . Financial resource strain: Not on file  . Food insecurity:    Worry: Not on file    Inability: Not on file  . Transportation needs:    Medical: Not on file    Non-medical: Not on file  Tobacco Use  . Smoking status: Never Smoker  . Smokeless tobacco: Never Used  Substance and Sexual Activity  . Alcohol use: Yes    Alcohol/week: 2.0 standard drinks    Types: 2 Shots of liquor per week  . Drug use: No  . Sexual activity: Not Currently  Lifestyle  . Physical activity:    Days per week: 0 days    Minutes per session: 0 min  . Stress: Not on file  Relationships  . Social connections:    Talks on phone: Not on file    Gets together: Not on file    Attends religious service: Not on file    Active member of club or organization: Not on file    Attends meetings of clubs or organizations: Not on file    Relationship status: Not on file  Other Topics Concern  . Not on file  Social History Narrative  . Not on file    Past Medical History:  Diagnosis Date  . Allergy   . Hypertension      Patient Active Problem List   Diagnosis Date Noted  . Weight loss counseling, encounter for 12/27/2016  . Plantar fasciitis 05/28/2015  . 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 Essential hypertension, benign 03/24/2015  . Insomnia 10/27/2007  . 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  Esophagitis, reflux 05/31/2007    Past Surgical History:  Procedure Laterality Date  . SHOULDER ARTHROSCOPY Left   . TONSILLECTOMY      Family History        Family Status  Relation Name Status  . Mother  Deceased  . Father  Deceased  . Brother  Alive  . Neg Hx  (Not Specified)        Her family history includes Heart attack in her father; Hypertension in her father; Lung cancer in her mother. There is no history of Breast cancer.      Allergies  Allergen Reactions  . Hydrocodone Other (See Comments)    Dizziness and ineffective     Current Outpatient Medications:  .  Ascorbic Acid (VITAMIN C) 1000 MG tablet, Take by mouth.,  Disp: , Rfl:  .  aspirin 81 MG tablet, Take 1 tablet (81 mg total) by mouth daily., Disp: 30 tablet, Rfl: 0 .  b complex vitamins tablet, Take 1 tablet daily by mouth., Disp: , Rfl:  .  Cranberry-Vitamin C-Vitamin E (CRANBERRY PLUS VITAMIN C) 4200-20-3 MG-MG-UNIT CAPS, Take by mouth., Disp: , Rfl:  .  Krill Oil 1000 MG CAPS, Take by mouth., Disp: , Rfl:  .  Multiple Vitamin (MULTIVITAMIN) tablet, Take 1 tablet by mouth daily., Disp: , Rfl:  .  VITAMIN E PO, Take 16,000 Units daily by mouth., Disp: , Rfl:  .  cetirizine (ZYRTEC) 10 MG tablet, Take 1 tablet (10 mg total) by mouth daily., Disp: 90 tablet, Rfl: 3 .  clonazePAM (KLONOPIN) 1 MG tablet, 1/2 - 1 at bedtime for insomnia., Disp: 90 tablet, Rfl: 3 .  omeprazole (PRILOSEC) 20 MG capsule, TAKE 1 CAPSULE DAILY BY  MOUTH., Disp: 90 capsule, Rfl: 3 .  triamterene-hydrochlorothiazide (DYAZIDE) 37.5-25 MG capsule, Take 1 each (1 capsule total) by mouth daily., Disp: 90 capsule, Rfl: 2   Patient Care Team: Virginia Crews, MD as PCP - General (Family Medicine)      Objective:   Vitals: BP 118/78 (BP Location: Left Arm, Patient Position: Sitting, Cuff Size: Normal)   Pulse (!) 58   Temp 98.7 F (37.1 C) (Oral)   Resp 16   Ht 4\' 11"  (1.499 m)   Wt 121 lb 3.2 oz (55 kg)   SpO2 99%   BMI 24.48 kg/m    Vitals:   12/28/17 0858  BP: 118/78  Pulse: (!) 58  Resp: 16  Temp: 98.7 F (37.1 C)  TempSrc: Oral  SpO2: 99%  Weight: 121 lb 3.2 oz (55 kg)  Height: 4\' 11"  (1.499 m)     Physical Exam  Constitutional: She is oriented to person, place, and time. She appears well-developed and well-nourished. No distress.  HENT:  Head: Normocephalic and atraumatic.  Right Ear: External ear normal.  Left Ear: External ear normal.  Nose: Nose normal.  Mouth/Throat: Oropharynx is clear and moist.  Eyes: Pupils are equal, round, and reactive to light. Conjunctivae and EOM are normal. No scleral icterus.  Neck: Neck supple. No thyromegaly  present.  Cardiovascular: Normal rate, regular rhythm, normal heart sounds and intact distal pulses.  No murmur heard. Pulmonary/Chest: Effort normal and breath sounds normal. No respiratory distress. She has no wheezes. She has no rales.  Abdominal: Soft. Bowel sounds are normal. She exhibits no distension. There is no tenderness. There is no rebound and no guarding.  Musculoskeletal: She exhibits no edema or deformity.  Lymphadenopathy:    She has no cervical adenopathy.  Neurological:  She is alert and oriented to person, place, and time.  Skin: Skin is warm and dry. Capillary refill takes less than 2 seconds. No rash noted.  Psychiatric: She has a normal mood and affect. Her behavior is normal.  Vitals reviewed.    Depression Screen PHQ 2/9 Scores 12/28/2017 12/27/2016  PHQ - 2 Score 0 2  PHQ- 9 Score - 5     Assessment & Plan:     Routine Health Maintenance and Physical Exam  Exercise Activities and Dietary recommendations Goals   None     Immunization History  Administered Date(s) Administered  . Influenza,inj,Quad PF,6+ Mos 12/27/2016, 12/28/2017  . Tdap 09/10/2011    Health Maintenance  Topic Date Due  . INFLUENZA VACCINE  09/22/2017  . COLONOSCOPY  12/20/2017  . MAMMOGRAM  02/17/2019  . PAP SMEAR  12/28/2019  . TETANUS/TDAP  09/09/2021  . Hepatitis C Screening  Completed  . HIV Screening  Completed     Discussed health benefits of physical activity, and encouraged her to engage in regular exercise appropriate for her age and condition.    --------------------------------------------------------------------  Problem List Items Addressed This Visit      Cardiovascular and Mediastinum   Essential hypertension, benign    Well controlled Continue Dyazide Will abstract recent labs        Other   Insomnia    Continue klonopin prn      Weight loss counseling, encounter for    Congratulated on maintaining normal BMI Goal weight remains 110lbs per  patient       Other Visit Diagnoses    Encounter for annual physical exam    -  Primary   Need for influenza vaccination       Relevant Orders   Flu Vaccine QUAD 36+ mos IM (Completed)   Screen for colon cancer       Relevant Orders   Ambulatory referral to Gastroenterology   Screening for breast cancer       Relevant Orders   MM DIAG BREAST TOMO UNI LEFT   US BREAST COMPLETE UNI LEFT INC AXILLA   MM 3D SCREEN BREAST UNI RIGHT   Abnormal mammogram       Relevant Orders   MM DIAG BREAST TOMO UNI LEFT   US BREAST COMPLETE UNI LEFT INC AXILLA       Return in about 6 months (around 06/28/2018) for chronic disease f/u.   The entirety of the information documented in the History of Present Illness, Review of Systems and Physical Exam were personally obtained by me. Portions of this information were initially documented by Hurman Horn, CMA and reviewed by me for thoroughness and accuracy.    Virginia Crews, MD, MPH [facility] 12/28/2017 10:07 AM

## 2017-12-29 LAB — RUBELLA IGG AB(REFL)
Mumps IgG: <9
RUBEOLA AB, IGG: >300
Rubella Antibodies, IGG: 2.43

## 2018-01-09 ENCOUNTER — Ambulatory Visit
Admission: RE | Admit: 2018-01-09 | Discharge: 2018-01-09 | Disposition: A | Discharge status: Home / Self Care | Payer: Managed Care, Other (non HMO) | Source: Ambulatory Visit | Attending: Family Medicine | Admitting: Family Medicine

## 2018-01-09 DIAGNOSIS — Z1239 Encounter for other screening for malignant neoplasm of breast: Secondary | ICD-10-CM | POA: Insufficient documentation

## 2018-01-09 DIAGNOSIS — R928 Other abnormal and inconclusive findings on diagnostic imaging of breast: Secondary | ICD-10-CM

## 2018-01-12 ENCOUNTER — Encounter
Admission: RE | Disposition: A | Discharge status: Home / Self Care | Payer: Self-pay | Source: Ambulatory Visit | Attending: Gastroenterology

## 2018-01-12 ENCOUNTER — Ambulatory Visit: Payer: Managed Care, Other (non HMO) | Admitting: Certified Registered Nurse Anesthetist

## 2018-01-12 ENCOUNTER — Ambulatory Visit
Admission: RE | Admit: 2018-01-12 | Discharge: 2018-01-12 | Disposition: A | Discharge status: Home / Self Care | Payer: Managed Care, Other (non HMO) | Source: Ambulatory Visit | Attending: Gastroenterology | Admitting: Gastroenterology

## 2018-01-12 ENCOUNTER — Other Ambulatory Visit: Payer: Self-pay

## 2018-01-12 DIAGNOSIS — Z79899 Other long term (current) drug therapy: Secondary | ICD-10-CM | POA: Diagnosis not present

## 2018-01-12 DIAGNOSIS — D125 Benign neoplasm of sigmoid colon: Secondary | ICD-10-CM

## 2018-01-12 DIAGNOSIS — K635 Polyp of colon: Secondary | ICD-10-CM | POA: Diagnosis not present

## 2018-01-12 DIAGNOSIS — Z1211 Encounter for screening for malignant neoplasm of colon: Secondary | ICD-10-CM

## 2018-01-12 DIAGNOSIS — D122 Benign neoplasm of ascending colon: Secondary | ICD-10-CM

## 2018-01-12 DIAGNOSIS — I10 Essential (primary) hypertension: Secondary | ICD-10-CM | POA: Diagnosis not present

## 2018-01-12 DIAGNOSIS — Z7982 Long term (current) use of aspirin: Secondary | ICD-10-CM | POA: Insufficient documentation

## 2018-01-12 HISTORY — DX: Adverse effect of unspecified anesthetic, initial encounter: T41.45XA

## 2018-01-12 HISTORY — PX: COLONOSCOPY WITH PROPOFOL: SHX5780

## 2018-01-12 HISTORY — DX: Nausea with vomiting, unspecified: R11.2

## 2018-01-12 HISTORY — DX: Other complications of anesthesia, initial encounter: T88.59XA

## 2018-01-12 HISTORY — DX: Other specified postprocedural states: Z98.890

## 2018-01-12 SURGERY — COLONOSCOPY WITH PROPOFOL
Anesthesia: General

## 2018-01-12 MED ORDER — SODIUM CHLORIDE 0.9 % IV SOLN
INTRAVENOUS | Status: DC
  Administered 2018-01-12: 09:00:00 via INTRAVENOUS

## 2018-01-12 MED ORDER — PROPOFOL 500 MG/50ML IV EMUL
INTRAVENOUS | Status: DC | PRN
  Administered 2018-01-12: 160 ug/kg/min via INTRAVENOUS

## 2018-01-12 MED ORDER — PROPOFOL 10 MG/ML IV BOLUS
INTRAVENOUS | Status: DC | PRN
  Administered 2018-01-12: 50 mg via INTRAVENOUS

## 2018-01-12 NOTE — Op Note (Addendum)
[facility] Gastroenterology Patient Name: Danielle Ortega Procedure Date: 01/12/2018 8:30 AM MRN: [id number] Account #: [id number] Date of Birth: [birth year] Admit Type: Outpatient Age: 62 Room: [facility] ENDO ROOM 1 Gender: Female Note Status: Finalized Procedure:            Colonoscopy Indications:          Screening for colorectal malignant neoplasm Providers:            Khiya Friese B. Bonna Gains MD, MD Referring MD:         [name]. [name] (Referring MD) Medicines:            Monitored Anesthesia Care Complications:        No immediate complications. Procedure:            Pre-Anesthesia Assessment:                       - ASA Grade Assessment: II - A patient with mild                        systemic disease.                       - Prior to the procedure, a History and Physical was                        performed, and patient medications, allergies and                        sensitivities were reviewed. The patient's tolerance of                        previous anesthesia was reviewed.                       - The risks and benefits of the procedure and the                        sedation options and risks were discussed with the                        patient. All questions were answered and informed                        consent was obtained.                       - Patient identification and proposed procedure were                        verified prior to the procedure by the physician, the                        nurse, the anesthesiologist, the anesthetist and the                        technician. The procedure was verified in the procedure                        room.  After obtaining informed consent, the colonoscope was                        passed under direct vision. Throughout the procedure,                        the patient's blood pressure, pulse, and oxygen                        saturations were monitored continuously. The                      Colonoscope was introduced through the anus and                        advanced to the the cecum, identified by appendiceal                        orifice and ileocecal valve. The colonoscopy was                        performed with ease. The patient tolerated the                        procedure well. The quality of the bowel preparation                        was good. Findings:      The perianal and digital rectal examinations were normal.      Two sessile polyps were found in the sigmoid colon and ascending colon.       The polyps were 2 to 3 mm in size. These polyps were removed with a cold       biopsy forceps. Resection and retrieval were complete.      The exam was otherwise without abnormality.      The rectum, sigmoid colon, descending colon, transverse colon, ascending       colon and cecum appeared normal.      The retroflexed view of the distal rectum and anal verge was normal and       showed no anal or rectal abnormalities. Impression:           - Two 2 to 3 mm polyps in the sigmoid colon and in the                        ascending colon, removed with a cold biopsy forceps.                        Resected and retrieved.                       - The examination was otherwise normal.                       - The rectum, sigmoid colon, descending colon,                        transverse colon, ascending colon and cecum are normal.                       - The distal rectum and anal  verge are normal on                        retroflexion view. Recommendation:       - Discharge patient to home (with escort).                       - Advance diet as tolerated.                       - Continue present medications.                       - Await pathology results.                       - Repeat colonoscopy in 5 years if polyps show adenoma,                        10 years if they are hyperplastic.                       - Last colonoscopy was in 2009 and 1  hyperplastic polyp                        was removed at the time.                       - The findings and recommendations were discussed with                        the patient.                       - The findings and recommendations were discussed with                        the patient's family.                       - Return to primary care physician as previously                        scheduled. Procedure Code(s):    --- Professional ---                       [phone], Colonoscopy, flexible; with biopsy, single or                        multiple Diagnosis Code(s):    --- Professional ---                       Z12.11, Encounter for screening for malignant neoplasm                        of colon                       D12.5, Benign neoplasm of sigmoid colon                       D12.2, Benign neoplasm of ascending colon CPT copyright 2018 American Medical Association. All rights reserved. The codes documented  in this report are preliminary and upon coder review may  be revised to meet current compliance requirements.  Vonda Antigua, MD Margretta Sidle B. Bonna Gains MD, MD 01/12/2018 9:20:48 AM This report has been signed electronically. Number of Addenda: 0 Note Initiated On: 01/12/2018 8:30 AM Scope Withdrawal Time: 0 hours 10 minutes 40 seconds  Total Procedure Duration: 0 hours 17 minutes 37 seconds  Estimated Blood Loss: Estimated blood loss: none.      [facility]

## 2018-01-12 NOTE — Anesthesia Preprocedure Evaluation (Signed)
Anesthesia Evaluation  Patient identified by MRN, date of birth, ID band Patient awake    Reviewed: Allergy & Precautions, H&P , NPO status , Patient's Chart, lab work & pertinent test results  History of Anesthesia Complications (+) PONV and history of anesthetic complications  Airway Mallampati: III  TM Distance: <3 FB Neck ROM: full    Dental  (+) Chipped   Pulmonary neg pulmonary ROS, neg shortness of breath,           Cardiovascular Exercise Tolerance: Good hypertension,      Neuro/Psych  Neuromuscular disease negative psych ROS   GI/Hepatic negative GI ROS, Neg liver ROS, neg GERD  ,  Endo/Other  negative endocrine ROS  Renal/GU negative Renal ROS  negative genitourinary   Musculoskeletal   Abdominal   Peds  Hematology negative hematology ROS (+)   Anesthesia Other Findings Past Medical History: No date: Allergy No date: Complication of anesthesia     Comment:  extreme nausea with shoulder surgery No date: Hypertension No date: PONV (postoperative nausea and vomiting)  Past Surgical History: No date: SHOULDER ARTHROSCOPY; Left No date: TONSILLECTOMY  BMI    Body Mass Index:  23.33 kg/m      Reproductive/Obstetrics negative OB ROS                             Anesthesia Physical Anesthesia Plan  ASA: III  Anesthesia Plan: General   Post-op Pain Management:    Induction: Intravenous  PONV Risk Score and Plan: Propofol infusion and TIVA  Airway Management Planned: Natural Airway and Nasal Cannula  Additional Equipment:   Intra-op Plan:   Post-operative Plan:   Informed Consent: I have reviewed the patients History and Physical, chart, labs and discussed the procedure including the risks, benefits and alternatives for the proposed anesthesia with the patient or authorized representative who has indicated his/her understanding and acceptance.   Dental Advisory  Given  Plan Discussed with: Anesthesiologist, CRNA and Surgeon  Anesthesia Plan Comments: (Patient consented for risks of anesthesia including but not limited to:  - adverse reactions to medications - risk of intubation if required - damage to teeth, lips or other oral mucosa - sore throat or hoarseness - Damage to heart, brain, lungs or loss of life  Patient voiced understanding.)        Anesthesia Quick Evaluation

## 2018-01-12 NOTE — Anesthesia Procedure Notes (Signed)
Performed by: Antavion Bartoszek, CRNA Pre-anesthesia Checklist: Patient identified, Emergency Drugs available, Suction available, Timeout performed and Patient being monitored Patient Re-evaluated:Patient Re-evaluated prior to induction Oxygen Delivery Method: Nasal cannula Induction Type: IV induction       

## 2018-01-12 NOTE — Anesthesia Post-op Follow-up Note (Signed)
Anesthesia QCDR form completed.        

## 2018-01-12 NOTE — Anesthesia Postprocedure Evaluation (Signed)
Anesthesia Post Note  Patient: Danielle Ortega  Procedure(s) Performed: COLONOSCOPY WITH PROPOFOL (N/A )  Patient location during evaluation: Endoscopy Anesthesia Type: General Level of consciousness: awake and alert Pain management: pain level controlled Vital Signs Assessment: post-procedure vital signs reviewed and stable Respiratory status: spontaneous breathing, nonlabored ventilation, respiratory function stable and patient connected to nasal cannula oxygen Cardiovascular status: blood pressure returned to baseline and stable Postop Assessment: no apparent nausea or vomiting Anesthetic complications: no     Last Vitals:  Vitals:   01/12/18 0928 01/12/18 0938  BP: 102/66 110/74  Pulse: 65 60  Resp: 14 (!) 9  Temp:    SpO2: 100% 100%    Last Pain:  Vitals:   01/12/18 0938  TempSrc:   PainSc: 0-No pain                 Precious Haws Piscitello

## 2018-01-12 NOTE — H&P (Signed)
Vonda Antigua, MD [address] [place], [place], [place], [address] [address], [place], [place], [place], [address] Phone: [phone]  Fax: [phone]  Primary Care Physician:  Virginia Crews, MD   Pre-Procedure History & Physical: HPI:  Danielle Ortega is a 62 y.o. female is here for a colonoscopy.   Past Medical History:  Diagnosis Date  . Allergy   . Hypertension     Past Surgical History:  Procedure Laterality Date  . SHOULDER ARTHROSCOPY Left   . TONSILLECTOMY      Prior to Admission medications   Medication Sig Start Date End Date Taking? Authorizing Provider  Ascorbic Acid (VITAMIN C) 1000 MG tablet Take by mouth.    [provider]  aspirin 81 MG tablet Take 1 tablet (81 mg total) by mouth daily. 05/05/17   Virginia Crews, MD  b complex vitamins tablet Take 1 tablet daily by mouth.    [provider]  cetirizine (ZYRTEC) 10 MG tablet Take 1 tablet (10 mg total) by mouth daily. 12/28/17   [name], [name], MD  clonazePAM (KLONOPIN) 1 MG tablet 1/2 - 1 at bedtime for insomnia. 12/28/17   [name], [name], MD  Cranberry-Vitamin C-Vitamin E (CRANBERRY PLUS VITAMIN C) 4200-20-3 MG-MG-UNIT CAPS Take by mouth.    [provider]  Javier Docker Oil 1000 MG CAPS Take by mouth.    [provider]  Multiple Vitamin (MULTIVITAMIN) tablet Take 1 tablet by mouth daily.    [provider]  omeprazole (PRILOSEC) 20 MG capsule TAKE 1 CAPSULE DAILY BY  MOUTH. 12/28/17   [name], [name], MD  triamterene-hydrochlorothiazide (DYAZIDE) 37.5-25 MG capsule Take 1 each (1 capsule total) by mouth daily. 12/28/17   Virginia Crews, MD  VITAMIN E PO Take 16,000 Units daily by mouth.    [provider]    Allergies as of 12/28/2017 - Review Complete 12/28/2017  Allergen Reaction Noted  . Hydrocodone Other (See Comments) 12/27/2016    Family History  Problem Relation Age of Onset  . Lung cancer Mother   .  Hypertension Father   . Heart attack Father   . Breast cancer Neg Hx     Social History   Socioeconomic History  . Marital status: Married    Spouse name: [name]  . Number of children: 0  . Years of education: 3  . Highest education level: Not on file  Occupational History  . Not on file  Social Needs  . Financial resource strain: Not on file  . Food insecurity:    Worry: Not on file    Inability: Not on file  . Transportation needs:    Medical: Not on file    Non-medical: Not on file  Tobacco Use  . Smoking status: Never Smoker  . Smokeless tobacco: Never Used  Substance and Sexual Activity  . Alcohol use: Yes    Alcohol/week: 2.0 standard drinks    Types: 2 Shots of liquor per week  . Drug use: No  . Sexual activity: Not Currently  Lifestyle  . Physical activity:    Days per week: 0 days    Minutes per session: 0 min  . Stress: Not on file  Relationships  . Social connections:    Talks on phone: Not on file    Gets together: Not on file    Attends religious service: Not on file    Active member of club or organization: Not on file    Attends  meetings of clubs or organizations: Not on file    Relationship status: Not on file  . Intimate partner violence:    Fear of current or ex partner: Not on file    Emotionally abused: Not on file    Physically abused: Not on file    Forced sexual activity: Not on file  Other Topics Concern  . Not on file  Social History Narrative  . Not on file    Review of Systems: See HPI, otherwise negative ROS  Physical Exam: There were no vitals taken for this visit. General:   Alert,  pleasant and cooperative in NAD Head:  Normocephalic and atraumatic. Neck:  Supple; no masses or thyromegaly. Lungs:  Clear throughout to auscultation, normal respiratory effort.    Heart:  +S1, +S2, Regular rate and rhythm, No edema. Abdomen:  Soft, nontender and nondistended. Normal bowel sounds, without guarding, and without rebound.    Neurologic:  Alert and  oriented x4;  grossly normal neurologically.  Impression/Plan: Danielle Ortega is here for a colonoscopy to be performed for average risk screening.  Risks, benefits, limitations, and alternatives regarding  colonoscopy have been reviewed with the patient.  Questions have been answered.  All parties agreeable.   Virgel Manifold, MD  01/12/2018, 8:23 AM

## 2018-01-12 NOTE — Transfer of Care (Signed)
Immediate Anesthesia Transfer of Care Note  Patient: Danielle Ortega  Procedure(s) Performed: COLONOSCOPY WITH PROPOFOL (N/A )  Patient Location: PACU  Anesthesia Type:General  Level of Consciousness: awake and alert   Airway & Oxygen Therapy: Patient Spontanous Breathing and Patient connected to nasal cannula oxygen  Post-op Assessment: Report given to RN and Post -op Vital signs reviewed and stable  Post vital signs: Reviewed and stable  Last Vitals:  Vitals Value Taken Time  BP 90/47 01/12/2018  9:19 AM  Temp 37 C 01/12/2018  9:18 AM  Pulse 66 01/12/2018  9:20 AM  Resp 18 01/12/2018  9:20 AM  SpO2 100 % 01/12/2018  9:20 AM  Vitals shown include unvalidated device data.  Last Pain:  Vitals:   01/12/18 0918  TempSrc: Tympanic  PainSc: 0-No pain         Complications: No apparent anesthesia complications

## 2018-01-13 ENCOUNTER — Encounter: Payer: Self-pay | Admitting: Gastroenterology

## 2018-01-16 ENCOUNTER — Encounter: Payer: Self-pay | Admitting: Gastroenterology

## 2018-01-16 LAB — SURGICAL PATHOLOGY

## 2018-03-16 DIAGNOSIS — L570 Actinic keratosis: Secondary | ICD-10-CM | POA: Diagnosis not present

## 2018-03-16 DIAGNOSIS — Z1283 Encounter for screening for malignant neoplasm of skin: Secondary | ICD-10-CM | POA: Diagnosis not present

## 2018-03-16 DIAGNOSIS — L578 Other skin changes due to chronic exposure to nonionizing radiation: Secondary | ICD-10-CM | POA: Diagnosis not present

## 2018-06-15 ENCOUNTER — Other Ambulatory Visit: Payer: Self-pay | Admitting: Family Medicine

## 2018-06-15 DIAGNOSIS — G4700 Insomnia, unspecified: Secondary | ICD-10-CM

## 2018-06-23 ENCOUNTER — Encounter: Payer: Self-pay | Admitting: Family Medicine

## 2018-06-23 ENCOUNTER — Telehealth: Payer: Self-pay

## 2018-06-23 NOTE — Telephone Encounter (Signed)
Patient called very upset because she received a message to confirm her appointment for 06/28/2018 @ 4:00 PM. Patient states she does not come in the office but once a year to be seen and she was not about to change it. Patient states that she will not becoming in to waste her money on a not needed appointment. I advised patient that she would have to be seen in the office for follow up appointments and medication refills. Patient stated again she never did that before and will not be in the office until her physical time. Patient stated if Dr. Jacinto Reap has a problem with it she could call herself and they could talk about it.FYI

## 2018-06-26 NOTE — Telephone Encounter (Signed)
Hypertension needs to be seen q99m and labs need to be obtaiend q8m to evaluate kidney function and electrolytes.  Furthermore, clinical policy states that if she is to continue on a controlled substance, she has to have been seen within the last 4 months to have a refill of that.  I do not see a need to call the patient personally.  If she needs refills of her medications, she will need to be seen.  Her appt was set at time of last CPE and she agreed to it then.

## 2018-06-26 NOTE — Telephone Encounter (Signed)
Patient advised. She still does not agree. She states she will not come until November for her CPE.

## 2018-06-28 ENCOUNTER — Ambulatory Visit: Payer: Managed Care, Other (non HMO) | Admitting: Family Medicine

## 2018-06-28 NOTE — Telephone Encounter (Signed)
Responded to patient's mychart message.

## 2018-09-28 ENCOUNTER — Other Ambulatory Visit: Payer: Self-pay | Admitting: Family Medicine

## 2018-09-28 DIAGNOSIS — I10 Essential (primary) hypertension: Secondary | ICD-10-CM

## 2018-09-29 NOTE — Telephone Encounter (Signed)
Yes, agreed about the appointment. Sent 30 days.

## 2018-09-29 NOTE — Telephone Encounter (Signed)
LVMTCB to schedule follow up appointment

## 2018-10-18 ENCOUNTER — Ambulatory Visit: Payer: 59 | Admitting: Family Medicine

## 2018-10-18 ENCOUNTER — Other Ambulatory Visit: Payer: Self-pay

## 2018-10-18 ENCOUNTER — Encounter: Payer: Self-pay | Admitting: Family Medicine

## 2018-10-18 VITALS — BP 124/82 | HR 64 | Temp 96.9°F | Wt 128.0 lb

## 2018-10-18 DIAGNOSIS — K2100 Gastro-esophageal reflux disease with esophagitis, without bleeding: Secondary | ICD-10-CM

## 2018-10-18 DIAGNOSIS — Z23 Encounter for immunization: Secondary | ICD-10-CM | POA: Diagnosis not present

## 2018-10-18 DIAGNOSIS — J301 Allergic rhinitis due to pollen: Secondary | ICD-10-CM | POA: Diagnosis not present

## 2018-10-18 DIAGNOSIS — J309 Allergic rhinitis, unspecified: Secondary | ICD-10-CM | POA: Insufficient documentation

## 2018-10-18 DIAGNOSIS — Z780 Asymptomatic menopausal state: Secondary | ICD-10-CM

## 2018-10-18 DIAGNOSIS — I10 Essential (primary) hypertension: Secondary | ICD-10-CM

## 2018-10-18 DIAGNOSIS — F5101 Primary insomnia: Secondary | ICD-10-CM

## 2018-10-18 DIAGNOSIS — G4700 Insomnia, unspecified: Secondary | ICD-10-CM

## 2018-10-18 MED ORDER — CETIRIZINE HCL 10 MG PO TABS: 10.0000 mg | ORAL_TABLET | Freq: Every day | ORAL | 3 refills | Status: DC

## 2018-10-18 MED ORDER — CLONAZEPAM 1 MG PO TABS: ORAL_TABLET | ORAL | 1 refills | Status: DC

## 2018-10-18 MED ORDER — TRIAMTERENE-HCTZ 37.5-25 MG PO CAPS: ORAL_CAPSULE | ORAL | 3 refills | Status: DC

## 2018-10-18 MED ORDER — OMEPRAZOLE 20 MG PO CPDR: DELAYED_RELEASE_CAPSULE | ORAL | 3 refills | Status: DC

## 2018-10-18 NOTE — Progress Notes (Signed)
Patient: Danielle Ortega Female    DOB: [birth year]   63 y.o.   MRN: [id number] Visit Date: 10/18/2018  Today's Provider: Lavon Paganini, MD   Chief Complaint  Patient presents with  . Hypertension   Subjective:    I, Tiburcio Pea, CMA, am acting as a Education administrator for Lavon Paganini, MD.    HPI  Hypertension, follow-up:  BP Readings from Last 3 Encounters:  10/18/18 124/82  01/12/18 110/74  12/28/17 118/78    She was last seen for hypertension 9 months ago.  BP at that visit was 118/78. Management changes since that visit include no changes. She reports good compliance with treatment. She is not having side effects.  She is not exercising. She is not adherent to low salt diet.   Outside blood pressures are not being checked at home. She is experiencing none.  Patient denies chest pain, chest pressure/discomfort, claudication, dyspnea, exertional chest pressure/discomfort, fatigue, irregular heart beat, lower extremity edema, near-syncope, orthopnea, palpitations, paroxysmal nocturnal dyspnea, syncope and tachypnea.   Cardiovascular risk factors include advanced age (older than 78 for men, 23 for women) and hypertension.  Use of agents associated with hypertension: none.     Weight trend: stable Wt Readings from Last 3 Encounters:  10/18/18 128 lb (58.1 kg)  01/12/18 115 lb 8 oz (52.4 kg)  12/28/17 121 lb 3.2 oz (55 kg)   ------------------------------------------------------------------------  Has been working from home since 3/16 due to pandemic    Allergies  Allergen Reactions  . Hydrocodone Other (See Comments)    Dizziness and ineffective     Current Outpatient Medications:  .  Ascorbic Acid (VITAMIN C) 1000 MG tablet, Take by mouth., Disp: , Rfl:  .  aspirin 81 MG tablet, Take 1 tablet (81 mg total) by mouth daily., Disp: 30 tablet, Rfl: 0 .  b complex vitamins tablet, Take 1 tablet daily by mouth., Disp: , Rfl:  .  cetirizine (ZYRTEC) 10 MG  tablet, Take 1 tablet (10 mg total) by mouth daily., Disp: 90 tablet, Rfl: 3 .  clonazePAM (KLONOPIN) 1 MG tablet, TAKE 1/2 TO 1 TABLET BY  MOUTH AT BEDTIME FOR  INSOMNIA., Disp: 90 tablet, Rfl: 0 .  Cranberry-Vitamin C-Vitamin E (CRANBERRY PLUS VITAMIN C) 4200-20-3 MG-MG-UNIT CAPS, Take by mouth., Disp: , Rfl:  .  Krill Oil 1000 MG CAPS, Take by mouth., Disp: , Rfl:  .  Multiple Vitamin (MULTIVITAMIN) tablet, Take 1 tablet by mouth daily., Disp: , Rfl:  .  omeprazole (PRILOSEC) 20 MG capsule, TAKE 1 CAPSULE DAILY BY  MOUTH., Disp: 90 capsule, Rfl: 3 .  triamterene-hydrochlorothiazide (DYAZIDE) 37.5-25 MG capsule, TAKE 1 CAPSULE BY MOUTH  EACH DAILY, Disp: 30 capsule, Rfl: 0 .  VITAMIN E PO, Take 16,000 Units daily by mouth., Disp: , Rfl:   Review of Systems  Constitutional: Negative.   Respiratory: Negative.   Cardiovascular: Negative.   Musculoskeletal: Negative.     Social History   Tobacco Use  . Smoking status: Never Smoker  . Smokeless tobacco: Never Used  Substance Use Topics  . Alcohol use: Yes    Alcohol/week: 2.0 standard drinks    Types: 2 Shots of liquor per week      Objective:   BP 124/82 (BP Location: Right Arm, Patient Position: Sitting, Cuff Size: Normal)   Pulse 64   Temp (!) 96.9 F (36.1 C) (Oral)   Wt 128 lb (58.1 kg)   SpO2 98%   BMI 25.85 kg/m  Vitals:   10/18/18 1036  BP: 124/82  Pulse: 64  Temp: (!) 96.9 F (36.1 C)  TempSrc: Oral  SpO2: 98%  Weight: 128 lb (58.1 kg)     Physical Exam Vitals signs reviewed.  Constitutional:      General: She is not in acute distress.    Appearance: Normal appearance. She is well-developed. She is not diaphoretic.  HENT:     Head: Normocephalic and atraumatic.  Eyes:     General: No scleral icterus.    Conjunctiva/sclera: Conjunctivae normal.  Neck:     Musculoskeletal: Neck supple.     Thyroid: No thyromegaly.  Cardiovascular:     Rate and Rhythm: Normal rate and regular rhythm.     Pulses:  Normal pulses.     Heart sounds: Normal heart sounds. No murmur.  Pulmonary:     Effort: Pulmonary effort is normal. No respiratory distress.     Breath sounds: Normal breath sounds. No wheezing, rhonchi or rales.  Musculoskeletal:     Right lower leg: No edema.     Left lower leg: No edema.  Lymphadenopathy:     Cervical: No cervical adenopathy.  Skin:    General: Skin is warm and dry.     Capillary Refill: Capillary refill takes less than 2 seconds.     Findings: No rash.  Neurological:     Mental Status: She is alert and oriented to person, place, and time. Mental status is at baseline.  Psychiatric:        Mood and Affect: Mood normal.        Behavior: Behavior normal.      No results found for any visits on 10/18/18.     Assessment & Plan   Problem List Items Addressed This Visit      Cardiovascular and Mediastinum   Essential hypertension, benign - Primary    Well controlled Continue current medications Recheck metabolic panel F/u in 6 months       Relevant Medications   triamterene-hydrochlorothiazide (DYAZIDE) 37.5-25 MG capsule   Other Relevant Orders   Comprehensive metabolic panel   Lipid panel     Respiratory   Allergic rhinitis    Well controlled Continue Zyrtec        Digestive   Esophagitis, reflux    Well controlled Continue PPI      Relevant Medications   omeprazole (PRILOSEC) 20 MG capsule     Other   Insomnia    Well controlled Continue Klonopin prn      Relevant Medications   clonazePAM (KLONOPIN) 1 MG tablet    Other Visit Diagnoses    Need for influenza vaccination       Relevant Orders   Flu Vaccine QUAD High Dose(Fluad) (Completed)   Postmenopausal       Relevant Orders   DG Bone Density       Return in about 3 months (around 01/18/2019) for CPE.   The entirety of the information documented in the History of Present Illness, Review of Systems and Physical Exam were personally obtained by me. Portions of this  information were initially documented by Tiburcio Pea, CMA and reviewed by me for thoroughness and accuracy.    [name], [name], MD MPH [facility]

## 2018-10-18 NOTE — Assessment & Plan Note (Signed)
Well controlled Continue Zyrtec

## 2018-10-18 NOTE — Assessment & Plan Note (Signed)
Well controlled Continue PPI 

## 2018-10-18 NOTE — Assessment & Plan Note (Signed)
Well controlled Continue Klonopin prn

## 2018-10-18 NOTE — Assessment & Plan Note (Signed)
Well controlled Continue current medications Recheck metabolic panel F/u in 6 months  

## 2018-10-19 ENCOUNTER — Telehealth: Payer: Self-pay | Admitting: Family Medicine

## 2018-10-19 DIAGNOSIS — Z87898 Personal history of other specified conditions: Secondary | ICD-10-CM

## 2018-10-19 DIAGNOSIS — Z1239 Encounter for other screening for malignant neoplasm of breast: Secondary | ICD-10-CM

## 2018-10-19 LAB — COMPREHENSIVE METABOLIC PANEL
ALT: 19 IU/L (ref 0–32)
AST: 19 IU/L (ref 0–40)
Albumin/Globulin Ratio: 2.9 — ABNORMAL HIGH (ref 1.2–2.2)
Albumin: 4.9 g/dL — ABNORMAL HIGH (ref 3.8–4.8)
Alkaline Phosphatase: 54 IU/L (ref 39–117)
BUN/Creatinine Ratio: 22 (ref 12–28)
BUN: 20 mg/dL (ref 8–27)
Bilirubin Total: 0.3 mg/dL (ref 0.0–1.2)
CO2: 25 mmol/L (ref 20–29)
Calcium: 10 mg/dL (ref 8.7–10.3)
Chloride: 101 mmol/L (ref 96–106)
Creatinine, Ser: 0.91 mg/dL (ref 0.57–1.00)
GFR calc Af Amer: 77 mL/min/{1.73_m2} (ref >59)
GFR calc non Af Amer: 66 mL/min/{1.73_m2} (ref >59)
Globulin, Total: 1.7 g/dL (ref 1.5–4.5)
Glucose: 97 mg/dL (ref 65–99)
Potassium: 4.2 mmol/L (ref 3.5–5.2)
Sodium: 141 mmol/L (ref 134–144)
Total Protein: 6.6 g/dL (ref 6.0–8.5)

## 2018-10-19 LAB — LIPID PANEL
Chol/HDL Ratio: 2.6 ratio (ref 0.0–4.4)
Cholesterol, Total: 211 mg/dL — ABNORMAL HIGH (ref 100–199)
HDL: 82 mg/dL (ref >39)
LDL Calculated: 112 mg/dL — ABNORMAL HIGH (ref 0–99)
Triglycerides: 84 mg/dL (ref 0–149)
VLDL Cholesterol Cal: 17 mg/dL (ref 5–40)

## 2018-10-19 NOTE — Telephone Encounter (Signed)
Pt states she is due in November to get a bilateral mammogram TOMO. Can you place order ?

## 2018-10-19 NOTE — Telephone Encounter (Signed)
Order placed for diagnostic Tomo bilateral as was recommended last year.

## 2018-10-19 NOTE — Telephone Encounter (Signed)
Left patient a message advising her that order has be placed.

## 2018-10-26 NOTE — Addendum Note (Signed)
Addended by: Virginia Crews on: 10/26/2018 10:12 AM   Modules accepted: Orders

## 2018-10-26 NOTE — Telephone Encounter (Signed)
Danielle Ortega is also requesting an order for limited left breast ultrasound in case it is needed [id number]

## 2018-10-26 NOTE — Telephone Encounter (Signed)
Order placed

## 2018-11-14 ENCOUNTER — Telehealth: Payer: Self-pay

## 2018-11-14 ENCOUNTER — Ambulatory Visit
Admission: RE | Admit: 2018-11-14 | Discharge: 2018-11-14 | Disposition: A | Discharge status: Home / Self Care | Payer: 59 | Source: Ambulatory Visit | Attending: Family Medicine | Admitting: Family Medicine

## 2018-11-14 DIAGNOSIS — Z780 Asymptomatic menopausal state: Secondary | ICD-10-CM | POA: Diagnosis not present

## 2018-11-14 NOTE — Telephone Encounter (Signed)
-----   Message from Virginia Crews, MD sent at 11/14/2018 10:39 AM EDT ----- Bone density scan shows osteopenia (this is some bone loss, but not as bad as osteoporosis).  Recommend regular weight bearing exercise, avoiding smoking, and adequate Ca (1200mg /day) and Vit D (1000 units daily) via diet or supplement.  We will recheck in 2-5 years to ensure this hasn't worsened.

## 2018-11-14 NOTE — Telephone Encounter (Signed)
Patient was advised and states that she will buy the Ca & Vit D supplements.

## 2019-01-11 ENCOUNTER — Ambulatory Visit
Admission: RE | Admit: 2019-01-11 | Discharge: 2019-01-11 | Disposition: A | Discharge status: Home / Self Care | Payer: 59 | Source: Ambulatory Visit | Attending: Family Medicine | Admitting: Family Medicine

## 2019-01-11 DIAGNOSIS — Z1239 Encounter for other screening for malignant neoplasm of breast: Secondary | ICD-10-CM

## 2019-01-11 DIAGNOSIS — Z87898 Personal history of other specified conditions: Secondary | ICD-10-CM

## 2019-01-26 ENCOUNTER — Ambulatory Visit (INDEPENDENT_AMBULATORY_CARE_PROVIDER_SITE_OTHER): Payer: 59 | Admitting: Family Medicine

## 2019-01-26 ENCOUNTER — Other Ambulatory Visit: Payer: Self-pay

## 2019-01-26 ENCOUNTER — Encounter: Payer: Self-pay | Admitting: Family Medicine

## 2019-01-26 VITALS — BP 131/86 | HR 62 | Temp 97.5°F | Resp 16 | Ht 59.5 in | Wt 137.0 lb

## 2019-01-26 DIAGNOSIS — K2100 Gastro-esophageal reflux disease with esophagitis, without bleeding: Secondary | ICD-10-CM | POA: Diagnosis not present

## 2019-01-26 DIAGNOSIS — Z0000 Encounter for general adult medical examination without abnormal findings: Secondary | ICD-10-CM | POA: Diagnosis not present

## 2019-01-26 DIAGNOSIS — I10 Essential (primary) hypertension: Secondary | ICD-10-CM

## 2019-01-26 DIAGNOSIS — G4700 Insomnia, unspecified: Secondary | ICD-10-CM | POA: Diagnosis not present

## 2019-01-26 DIAGNOSIS — Z23 Encounter for immunization: Secondary | ICD-10-CM

## 2019-01-26 MED ORDER — CLONAZEPAM 1 MG PO TABS: ORAL_TABLET | ORAL | 1 refills | Status: DC

## 2019-01-26 MED ORDER — TRIAMTERENE-HCTZ 37.5-25 MG PO CAPS: ORAL_CAPSULE | ORAL | 3 refills | Status: DC

## 2019-01-26 MED ORDER — OMEPRAZOLE 20 MG PO CPDR: DELAYED_RELEASE_CAPSULE | ORAL | 3 refills | Status: DC

## 2019-01-26 MED ORDER — CETIRIZINE HCL 10 MG PO TABS: 10.0000 mg | ORAL_TABLET | Freq: Every day | ORAL | 3 refills | Status: DC

## 2019-01-26 NOTE — Progress Notes (Signed)
Patient: Danielle Ortega, Female    DOB: Jul 09, 1955, 63 y.o.   MRN: [id number] Visit Date: 01/26/2019  Today's Provider: Lavon Paganini, MD   Chief Complaint  Patient presents with  . Annual Exam   Subjective:    I [place] S. Dimas, CMA, am acting as scribe for Lavon Paganini, MD.   Annual physical exam Danielle Ortega is a 63 y.o. female who presents today for health maintenance and complete physical. She feels well. She reports exercising no. She reports she is sleeping well. 12/28/2017 CPE 12/27/2016 Pap/HPV-negative 01/11/2019 Mammogram-BI-RADS 1 11/14/2018 MBD-Osteopenia 01/12/2018 Colonoscopy-polyps. ----------------------------------------------------------------- She wonders if she may have a fractured toe that is healing She knocked a bottle of shower gel off the shelf and it fell directly onto her 4th toe on L foot.  Bruising immediately and has resolved.  This occurred 1.5 weeks ago. Still somewhat tender to touch. No pain with weight bearing.  She is working ~12 hours per day now.  She is also on call.  She is very fatigued when she gets home from work.  Review of Systems  Constitutional: Negative.   HENT: Positive for hearing loss. Negative for congestion, dental problem, drooling, ear discharge, ear pain, facial swelling, mouth sores, nosebleeds, postnasal drip, rhinorrhea, sinus pressure, sinus pain, sneezing, sore throat, tinnitus, trouble swallowing and voice change.   Eyes: Negative.   Respiratory: Negative.   Cardiovascular: Negative.   Gastrointestinal: Negative.   Endocrine: Negative.   Genitourinary: Negative.   Musculoskeletal: Negative.   Skin: Negative.   Allergic/Immunologic: Negative.   Neurological: Negative.   Hematological: Negative.   Psychiatric/Behavioral: Negative.     Social History      She  reports that she has never smoked. She has never used smokeless tobacco. She reports current alcohol use of about 2.0 standard drinks of  alcohol per week. She reports that she does not use drugs.       Social History   Socioeconomic History  . Marital status: Married    Spouse name: [name]  . Number of children: 0  . Years of education: 61  . Highest education level: Not on file  Occupational History  . Not on file  Social Needs  . Financial resource strain: Not on file  . Food insecurity    Worry: Not on file    Inability: Not on file  . Transportation needs    Medical: Not on file    Non-medical: Not on file  Tobacco Use  . Smoking status: Never Smoker  . Smokeless tobacco: Never Used  Substance and Sexual Activity  . Alcohol use: Yes    Alcohol/week: 2.0 standard drinks    Types: 2 Shots of liquor per week  . Drug use: No  . Sexual activity: Not Currently  Lifestyle  . Physical activity    Days per week: 0 days    Minutes per session: 0 min  . Stress: Not on file  Relationships  . Social Herbalist on phone: Not on file    Gets together: Not on file    Attends religious service: Not on file    Active member of club or organization: Not on file    Attends meetings of clubs or organizations: Not on file    Relationship status: Not on file  Other Topics Concern  . Not on file  Social History Narrative  . Not on file    Past Medical History:  Diagnosis Date  .  Allergy   . Complication of anesthesia    extreme nausea with shoulder surgery  . Hypertension   . PONV (postoperative nausea and vomiting)      Patient Active Problem List   Diagnosis Date Noted  . Allergic rhinitis 10/18/2018  . Encounter for screening colonoscopy   . Polyp of sigmoid colon   . Benign neoplasm of ascending colon   . Weight loss counseling, encounter for 12/27/2016  . Plantar fasciitis 05/28/2015  . 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 Essential hypertension, benign 03/24/2015  . Insomnia 10/27/2007  . Esophagitis, reflux 05/31/2007    Past Surgical History:   Procedure Laterality Date  . COLONOSCOPY WITH PROPOFOL N/A 01/12/2018   Procedure: COLONOSCOPY WITH PROPOFOL;  Surgeon: Virgel Manifold, MD;  Location: [facility] ENDOSCOPY;  Service: Endoscopy;  Laterality: N/A;  . SHOULDER ARTHROSCOPY Left   . TONSILLECTOMY      Family History        Family Status  Relation Name Status  . Mother  Deceased  . Father  Deceased  . Brother  Alive  . Neg Hx  (Not Specified)        Her family history includes Heart attack in her father; Hypertension in her father; Lung cancer in her mother. There is no history of Breast cancer.      Allergies  Allergen Reactions  . Hydrocodone Other (See Comments)    Dizziness and ineffective     Current Outpatient Medications:  .  Ascorbic Acid (VITAMIN C) 1000 MG tablet, Take by mouth., Disp: , Rfl:  .  aspirin 81 MG tablet, Take 1 tablet (81 mg total) by mouth daily., Disp: 30 tablet, Rfl: 0 .  Biotin 5000 MCG CAPS, Take by mouth., Disp: , Rfl:  .  cetirizine (ZYRTEC) 10 MG tablet, Take 1 tablet (10 mg total) by mouth daily., Disp: 90 tablet, Rfl: 3 .  clonazePAM (KLONOPIN) 1 MG tablet, TAKE 1/2 TO 1 TABLET BY  MOUTH AT BEDTIME FOR  INSOMNIA., Disp: 90 tablet, Rfl: 1 .  Cranberry-Vitamin C-Vitamin E (CRANBERRY PLUS VITAMIN C) 4200-20-3 MG-MG-UNIT CAPS, Take by mouth., Disp: , Rfl:  .  Krill Oil 1000 MG CAPS, Take by mouth., Disp: , Rfl:  .  Multiple Vitamin (MULTIVITAMIN) tablet, Take 1 tablet by mouth daily., Disp: , Rfl:  .  omeprazole (PRILOSEC) 20 MG capsule, TAKE 1 CAPSULE DAILY BY  MOUTH., Disp: 90 capsule, Rfl: 3 .  triamterene-hydrochlorothiazide (DYAZIDE) 37.5-25 MG capsule, TAKE 1 CAPSULE BY MOUTH  EACH DAILY, Disp: 90 capsule, Rfl: 3 .  VITAMIN E PO, Take 16,000 Units daily by mouth., Disp: , Rfl:    Patient Care Team: Virginia Crews, MD as PCP - General (Family Medicine)    Objective:    Vitals: BP 131/86 (BP Location: Left Arm, Patient Position: Sitting, Cuff Size: Normal)   Pulse 62    Temp (!) 97.5 F (36.4 C) (Temporal)   Resp 16   Ht 4' 11.5" (1.511 m)   Wt 137 lb (62.1 kg)   BMI 27.21 kg/m    Vitals:   01/26/19 1010  BP: 131/86  Pulse: 62  Resp: 16  Temp: (!) 97.5 F (36.4 C)  TempSrc: Temporal  Weight: 137 lb (62.1 kg)  Height: 4' 11.5" (1.511 m)     Physical Exam Vitals signs reviewed.  Constitutional:      General: She is not in acute distress.    Appearance: Normal appearance. She is well-developed. She is  not diaphoretic.  HENT:     Head: Normocephalic and atraumatic.     Right Ear: External ear normal.     Left Ear: External ear normal.  Eyes:     General: No scleral icterus.    Conjunctiva/sclera: Conjunctivae normal.     Pupils: Pupils are equal, round, and reactive to light.  Neck:     Musculoskeletal: Neck supple.     Thyroid: No thyromegaly.  Cardiovascular:     Rate and Rhythm: Normal rate and regular rhythm.     Pulses: Normal pulses.     Heart sounds: Normal heart sounds. No murmur.  Pulmonary:     Effort: Pulmonary effort is normal. No respiratory distress.     Breath sounds: Normal breath sounds. No wheezing or rales.  Abdominal:     General: There is no distension.     Palpations: Abdomen is soft.     Tenderness: There is no abdominal tenderness.  Musculoskeletal:        General: No deformity.     Right lower leg: No edema.     Left lower leg: No edema.     Comments: Mild swelling and slight bruising of left fourth distal toe with mild tenderness to palpation.  Range of motion is intact.  No tenderness palpation over MTP joint or metatarsal bones.  Able to bear weight and walk 4 steps.  Lymphadenopathy:     Cervical: No cervical adenopathy.  Skin:    General: Skin is warm and dry.     Capillary Refill: Capillary refill takes less than 2 seconds.     Findings: No rash.  Neurological:     Mental Status: She is alert and oriented to person, place, and time. Mental status is at baseline.  Psychiatric:        Mood and  Affect: Mood normal.        Behavior: Behavior normal.        Thought Content: Thought content normal.      Depression Screen PHQ 2/9 Scores 01/26/2019 12/28/2017 12/27/2016  PHQ - 2 Score 1 0 2  PHQ- 9 Score 2 - 5   Audit-C Alcohol Use Screening   Alcohol Use Disorder Test (AUDIT) 12/28/2017 01/26/2019  1. How often do you have a drink containing alcohol? 3 2  2. How many drinks containing alcohol do you have on a typical day when you are drinking? 0 0  3. How often do you have six or more drinks on one occasion? 0 0  AUDIT-C Score 3 2  4. How often during the last year have you found that you were not able to stop drinking once you had started? 0 -  5. How often during the last year have you failed to do what was normally expected from you becasue of drinking? 0 -  6. How often during the last year have you needed a first drink in the morning to get yourself going after a heavy drinking session? 0 -  7. How often during the last year have you had a feeling of guilt of remorse after drinking? 0 -  8. How often during the last year have you been unable to remember what happened the night before because you had been drinking? 0 -  9. Have you or someone else been injured as a result of your drinking? 0 -  10. Has a relative or friend or a doctor or another health worker been concerned about your drinking or suggested you cut  down? 0 -  Alcohol Use Disorder Identification Test Final Score (AUDIT) 3 -  Alcohol Brief Interventions/Follow-up - AUDIT Score <7 follow-up not indicated    A score of 3 or more in women, and 4 or more in men indicates increased risk for alcohol abuse, EXCEPT if all of the points are from question 1     Assessment & Plan:     Routine Health Maintenance and Physical Exam  Exercise Activities and Dietary recommendations Goals   None     Immunization History  Administered Date(s) Administered  . Fluad Quad(high Dose 65+) 10/18/2018  . Influenza,inj,Quad  PF,6+ Mos 12/27/2016, 12/28/2017  . Tdap 09/10/2011    Health Maintenance  Topic Date Due  . PNA vac Low Risk Adult (1 of 2 - PCV13) 05/17/2018  . PAP SMEAR-Modifier  12/28/2019  . MAMMOGRAM  01/10/2021  . TETANUS/TDAP  09/09/2021  . COLONOSCOPY  01/13/2023  . INFLUENZA VACCINE  Completed  . DEXA SCAN  Completed  . Hepatitis C Screening  Completed  . HIV Screening  Completed     Discussed health benefits of physical activity, and encouraged her to engage in regular exercise appropriate for her age and condition.    --------------------------------------------------------------------  Problem List Items Addressed This Visit      Cardiovascular and Mediastinum   Essential hypertension, benign    Well controlled Continue current medications Recheck metabolic panel F/u in 6 months       Relevant Medications   triamterene-hydrochlorothiazide (DYAZIDE) 37.5-25 MG capsule     Digestive   Esophagitis, reflux    Well-controlled Continue PPI      Relevant Medications   omeprazole (PRILOSEC) 20 MG capsule     Other   Insomnia    Well-controlled Continue Klonopin as needed      Relevant Medications   clonazePAM (KLONOPIN) 1 MG tablet    Other Visit Diagnoses    Encounter for annual physical exam    -  Primary   Need for vaccination against Streptococcus pneumoniae       Relevant Orders   Pneumococcal conjugate vaccine 13-valent IM (Completed)   Need for shingles vaccine       Relevant Orders   Varicella-zoster vaccine IM (Completed)       Return in about 6 months (around 07/27/2019) for chronic disease f/u.  And second shingles vaccination   The entirety of the information documented in the History of Present Illness, Review of Systems and Physical Exam were personally obtained by me. Portions of this information were initially documented by Lynford Humphrey, CMA and reviewed by me for thoroughness and accuracy.    Lejend Dalby, [name], MD MPH [facility]

## 2019-01-26 NOTE — Assessment & Plan Note (Signed)
Well controlled Continue PPI 

## 2019-01-26 NOTE — Patient Instructions (Signed)
Preventive Care 65 Years and Older, Female Preventive care refers to lifestyle choices and visits with your health care provider that can promote health and wellness. This includes:  A yearly physical exam. This is also called an annual well check.  Regular dental and eye exams.  Immunizations.  Screening for certain conditions.  Healthy lifestyle choices, such as diet and exercise. What can I expect for my preventive care visit? Physical exam Your health care provider will check:  Height and weight. These may be used to calculate body mass index (BMI), which is a measurement that tells if you are at a healthy weight.  Heart rate and blood pressure.  Your skin for abnormal spots. Counseling Your health care provider may ask you questions about:  Alcohol, tobacco, and drug use.  Emotional well-being.  Home and relationship well-being.  Sexual activity.  Eating habits.  History of falls.  Memory and ability to understand (cognition).  Work and work Statistician.  Pregnancy and menstrual history. What immunizations do I need?  Influenza (flu) vaccine  This is recommended every year. Tetanus, diphtheria, and pertussis (Tdap) vaccine  You may need a Td booster every 10 years. Varicella (chickenpox) vaccine  You may need this vaccine if you have not already been vaccinated. Zoster (shingles) vaccine  You may need this after age 65. Pneumococcal conjugate (PCV13) vaccine  One dose is recommended after age 65. Pneumococcal polysaccharide (PPSV23) vaccine  One dose is recommended after age 72. Measles, mumps, and rubella (MMR) vaccine  You may need at least one dose of MMR if you were born in 1957 or later. You may also need a second dose. Meningococcal conjugate (MenACWY) vaccine  You may need this if you have certain conditions. Hepatitis A vaccine  You may need this if you have certain conditions or if you travel or work in places where you may be exposed  to hepatitis A. Hepatitis B vaccine  You may need this if you have certain conditions or if you travel or work in places where you may be exposed to hepatitis B. Haemophilus influenzae type b (Hib) vaccine  You may need this if you have certain conditions. You may receive vaccines as individual doses or as more than one vaccine together in one shot (combination vaccines). Talk with your health care provider about the risks and benefits of combination vaccines. What tests do I need? Blood tests  Lipid and cholesterol levels. These may be checked every 5 years, or more frequently depending on your overall health.  Hepatitis C test.  Hepatitis B test. Screening  Lung cancer screening. You may have this screening every year starting at age 65 if you have a 30-pack-year history of smoking and currently smoke or have quit within the past 15 years.  Colorectal cancer screening. All adults should have this screening starting at age 65 and continuing until age 15. Your health care provider may recommend screening at age 65 if you are at increased risk. You will have tests every 1-10 years, depending on your results and the type of screening test.  Diabetes screening. This is done by checking your blood sugar (glucose) after you have not eaten for a while (fasting). You may have this done every 1-3 years.  Mammogram. This may be done every 1-2 years. Talk with your health care provider about how often you should have regular mammograms.  BRCA-related cancer screening. This may be done if you have a family history of breast, ovarian, tubal, or peritoneal cancers.  Other tests  Sexually transmitted disease (STD) testing.  Bone density scan. This is done to screen for osteoporosis. You may have this done starting at age 65. Follow these instructions at home: Eating and drinking  Eat a diet that includes fresh fruits and vegetables, whole grains, lean protein, and low-fat dairy products. Limit  your intake of foods with high amounts of sugar, saturated fats, and salt.  Take vitamin and mineral supplements as recommended by your health care provider.  Do not drink alcohol if your health care provider tells you not to drink.  If you drink alcohol: ? Limit how much you have to 0-1 drink a day. ? Be aware of how much alcohol is in your drink. In the [place] one drink equals one 12 oz bottle of beer (355 mL), one 5 oz glass of wine (148 mL), or one 1 oz glass of hard liquor (44 mL). Lifestyle  Take daily care of your teeth and gums.  Stay active. Exercise for at least 30 minutes on 5 or more days each week.  Do not use any products that contain nicotine or tobacco, such as cigarettes, e-cigarettes, and chewing tobacco. If you need help quitting, ask your health care provider.  If you are sexually active, practice safe sex. Use a condom or other form of protection in order to prevent STIs (sexually transmitted infections).  Talk with your health care provider about taking a low-dose aspirin or statin. What's next?  Go to your health care provider once a year for a well check visit.  Ask your health care provider how often you should have your eyes and teeth checked.  Stay up to date on all vaccines. This information is not intended to replace advice given to you by your health care provider. Make sure you discuss any questions you have with your health care provider. Document Released: 03/07/2015 Document Revised: 02/02/2018 Document Reviewed: 02/02/2018 Elsevier Patient Education  2020 [employer].

## 2019-01-26 NOTE — Assessment & Plan Note (Signed)
Well-controlled Continue Klonopin as needed

## 2019-01-26 NOTE — Assessment & Plan Note (Signed)
Well controlled Continue current medications Recheck metabolic panel F/u in 6 months  

## 2019-03-30 ENCOUNTER — Ambulatory Visit (INDEPENDENT_AMBULATORY_CARE_PROVIDER_SITE_OTHER): Payer: 59 | Admitting: Family Medicine

## 2019-03-30 ENCOUNTER — Other Ambulatory Visit: Payer: Self-pay

## 2019-03-30 ENCOUNTER — Encounter: Payer: Self-pay | Admitting: Family Medicine

## 2019-03-30 DIAGNOSIS — Z23 Encounter for immunization: Secondary | ICD-10-CM | POA: Diagnosis not present

## 2019-03-30 NOTE — Progress Notes (Signed)
Patient here for Shingrix vaccination only.  I did not examine the patient.  I did review his medical history, medications, and allergies and vaccine consent form.  CMA gave vaccination. Patient tolerated well.  Virginia Crews, MD, MPH [facility] 03/30/2019 10:53 AM

## 2019-07-30 ENCOUNTER — Encounter: Payer: Self-pay | Admitting: Family Medicine

## 2019-07-30 ENCOUNTER — Ambulatory Visit: Payer: 59 | Admitting: Family Medicine

## 2019-07-30 ENCOUNTER — Other Ambulatory Visit: Payer: Self-pay

## 2019-07-30 VITALS — BP 124/80 | HR 65 | Temp 95.5°F | Wt 139.0 lb

## 2019-07-30 DIAGNOSIS — I10 Essential (primary) hypertension: Secondary | ICD-10-CM

## 2019-07-30 DIAGNOSIS — F5101 Primary insomnia: Secondary | ICD-10-CM

## 2019-07-30 DIAGNOSIS — E7800 Pure hypercholesterolemia, unspecified: Secondary | ICD-10-CM | POA: Diagnosis not present

## 2019-07-30 DIAGNOSIS — K2100 Gastro-esophageal reflux disease with esophagitis, without bleeding: Secondary | ICD-10-CM | POA: Diagnosis not present

## 2019-07-30 MED ORDER — TRAZODONE HCL 100 MG PO TABS: 50.0000 mg | ORAL_TABLET | Freq: Every day | ORAL | 3 refills | Status: DC

## 2019-07-30 NOTE — Assessment & Plan Note (Addendum)
Pt with history of HTN that is well controlled on medication Pt is currently taking tiramterene-HCTZ 37.5-25mg  BP today in office is 124/80 ASCVD risk is 6.9%  Plan: Discussed with pt the importance of diet and exercise for healthy weight maintenance Will continue medication as prescribed Will check CBC Will check Lipid Panel Will consider statin in the future

## 2019-07-30 NOTE — Patient Instructions (Signed)

## 2019-07-30 NOTE — Progress Notes (Signed)
See note from Chipper Herb, medical student, attested by me from same date of service

## 2019-07-30 NOTE — Assessment & Plan Note (Signed)
Pt with hypercholesterolemia Asymptomatic ASCVD risk is 6.9  Plan: Discussed with pt the importance of diet and exercise for healthy weight maintenance Will check lipid panel today

## 2019-07-30 NOTE — Assessment & Plan Note (Signed)
Pt with history of GERD that is well controled on medication Pt is taking omeprazole 20mg  daily Pt with very rare symptoms  Plan: Will continue taking medications as prescribed

## 2019-07-30 NOTE — Assessment & Plan Note (Addendum)
Pt with history of insomnia that is controlled on klonopin Pt is on chronic benzodiazepines for symptom of insomnia  Plan Discussed with pt the long term side-effects of chronic benzo use Will start taking trazodone 100mg  instead of klonopin

## 2019-07-30 NOTE — Progress Notes (Signed)
Established patient visit   Patient: Danielle Ortega   DOB: 12-24-1955   64 y.o. Female  MRN: [id number] Visit Date: 07/30/2019  Today's healthcare provider: Lavon Paganini, MD   Chief Complaint  Patient presents with  . Hypertension  . Insomnia   Subjective    HPI   Hypertension, follow-up:  BP Readings from Last 3 Encounters:  07/30/19 124/80  01/26/19 131/86  10/18/18 124/82    She was last seen for hypertension 6 months ago.  BP at that visit was 124/80. Management changes since that visit include tiramterene-HCTZ 37.5-25mg . She reports excellent compliance with treatment. She is not having side effects.  She is not exercising. She is adherent to low salt diet.   Outside blood pressures are 120/80 at home. She is experiencing none.  Patient denies chest pain, chest pressure/discomfort, claudication, dyspnea, exertional chest pressure/discomfort, fatigue, irregular heart beat, lower extremity edema, near-syncope, palpitations, syncope and tachypnea.   Cardiovascular risk factors include advanced age (older than 35 for men, 74 for women) and hypertension.  Use of agents associated with hypertension: none.     Weight trend: increasing steadily Wt Readings from Last 3 Encounters:  07/30/19 139 lb (63 kg)  01/26/19 137 lb (62.1 kg)  10/18/18 128 lb (58.1 kg)    Current diet: in general, a "healthy" diet     The 10-year ASCVD risk score Mikey Bussing DC Jr., et al., 2013) is: 6.9%   Values used to calculate the score:     Age: 64 years     Sex: Female     Is Non-Hispanic African American: No     Diabetic: No     Tobacco smoker: No     Systolic Blood Pressure: 916 mmHg     Is BP treated: Yes     HDL Cholesterol: 82 mg/dL     Total Cholesterol: 211 mg/dL   ------------------------------------------------------------------------  Follow up for Insomnia  The patient was last seen for this 6 months ago. Changes made at last visit include Klonopin. Pt is  taking half a pill of klonopin every night before a work day to stay on schedule  Pt takes a full pill of klonopin after a stressful day She states that whenever she does not take medication, she does not feel sleepy and fights to go to bed until 1-2am She feels that condition is stable. She is not having side effects.   ------------------------------------------------------------------------------------  Follow up for Esophagitis  The patient was last seen for this 6 months ago. Changes made at last visit include Omeprazole.  She reports excellent compliance with treatment. She feels that condition is stable. She is not having side effects.   Rarely gets symptoms of acid reflux When pt does have symptoms, they occur at night   ------------------------------------------------------------------------------------     Social History   Tobacco Use  . Smoking status: Never Smoker  . Smokeless tobacco: Never Used  Substance Use Topics  . Alcohol use: Yes    Alcohol/week: 2.0 standard drinks    Types: 2 Shots of liquor per week  . Drug use: No   Social History   Socioeconomic History  . Marital status: Married    Spouse name: [name]  . Number of children: 0  . Years of education: 40  . Highest education level: Not on file  Occupational History  . Not on file  Tobacco Use  . Smoking status: Never Smoker  . Smokeless tobacco: Never Used  Substance and Sexual  Activity  . Alcohol use: Yes    Alcohol/week: 2.0 standard drinks    Types: 2 Shots of liquor per week  . Drug use: No  . Sexual activity: Not Currently  Other Topics Concern  . Not on file  Social History Narrative  . Not on file   Social Determinants of Health   Financial Resource Strain:   . Difficulty of Paying Living Expenses:   Food Insecurity:   . Worried About Charity fundraiser in the Last Year:   . Arboriculturist in the Last Year:   Transportation Needs:   . Film/video editor  (Medical):   Marland Kitchen Lack of Transportation (Non-Medical):   Physical Activity:   . Days of Exercise per Week:   . Minutes of Exercise per Session:   Stress:   . Feeling of Stress :   Social Connections:   . Frequency of Communication with Friends and Family:   . Frequency of Social Gatherings with Friends and Family:   . Attends Religious Services:   . Active Member of Clubs or Organizations:   . Attends Archivist Meetings:   Marland Kitchen Marital Status:   Intimate Partner Violence:   . Fear of Current or Ex-Partner:   . Emotionally Abused:   Marland Kitchen Physically Abused:   . Sexually Abused:        Medications: Outpatient Medications Prior to Visit  Medication Sig  . Ascorbic Acid (VITAMIN C) 1000 MG tablet Take by mouth.  Marland Kitchen aspirin 81 MG tablet Take 1 tablet (81 mg total) by mouth daily.  . Biotin 5000 MCG CAPS Take by mouth.  . cetirizine (ZYRTEC) 10 MG tablet Take 1 tablet (10 mg total) by mouth daily.  . clonazePAM (KLONOPIN) 1 MG tablet TAKE 1/2 TO 1 TABLET BY  MOUTH AT BEDTIME FOR  INSOMNIA.  Marland Kitchen Cranberry 425 MG CAPS Take by mouth.  . Cranberry-Vitamin C-Vitamin E (CRANBERRY PLUS VITAMIN C) 4200-20-3 MG-MG-UNIT CAPS Take by mouth.  Javier Docker Oil 1000 MG CAPS Take by mouth.  . Multiple Vitamin (MULTIVITAMIN) tablet Take 1 tablet by mouth daily.  Marland Kitchen omeprazole (PRILOSEC) 20 MG capsule TAKE 1 CAPSULE DAILY BY  MOUTH.  . triamterene-hydrochlorothiazide (DYAZIDE) 37.5-25 MG capsule TAKE 1 CAPSULE BY MOUTH  EACH DAILY  . VITAMIN E PO Take 16,000 Units daily by mouth.   No facility-administered medications prior to visit.    Review of Systems  Constitutional: Negative.   HENT: Negative.   Eyes: Negative.   Respiratory: Negative.   Cardiovascular: Negative.   Gastrointestinal: Negative.   Endocrine: Negative.   Genitourinary: Negative.   Musculoskeletal: Negative.   Skin: Negative.   Allergic/Immunologic: Negative.   Neurological: Negative.   Hematological: Negative.     Psychiatric/Behavioral: Negative.     Last CBC Lab Results  Component Value Date   WBC 4.0 12/29/2016   HGB 14.7 12/29/2016   HCT 43.4 12/29/2016   MCV 90 12/29/2016   MCH 30.4 12/29/2016   RDW 14.1 12/29/2016   PLT 174 02/72/5366   Last metabolic panel Lab Results  Component Value Date   GLUCOSE 97 10/18/2018   NA 141 10/18/2018   K 4.2 10/18/2018   CL 101 10/18/2018   CO2 25 10/18/2018   BUN 20 10/18/2018   CREATININE 0.91 10/18/2018   GFRNONAA 66 10/18/2018   GFRAA 77 10/18/2018   CALCIUM 10.0 10/18/2018   PROT 6.6 10/18/2018   ALBUMIN 4.9 (H) 10/18/2018   LABGLOB 1.7 10/18/2018  AGRATIO 2.9 (H) 10/18/2018   BILITOT 0.3 10/18/2018   ALKPHOS 54 10/18/2018   AST 19 10/18/2018   ALT 19 10/18/2018   Last lipids Lab Results  Component Value Date   CHOL 211 (H) 10/18/2018   HDL 82 10/18/2018   LDLCALC 112 (H) 10/18/2018   TRIG 84 10/18/2018   CHOLHDL 2.6 10/18/2018   Last hemoglobin A1c Lab Results  Component Value Date   HGBA1C 5.5 09/30/2017   Last vitamin B12 and Folate No results found for: VITAMINB12, FOLATE    Objective    BP 124/80 (BP Location: Left Arm, Patient Position: Sitting, Cuff Size: Large)   Pulse 65   Temp (!) 95.5 F (35.3 C) (Temporal)   Wt 139 lb (63 kg)   SpO2 99%   BMI 27.60 kg/m  BP Readings from Last 3 Encounters:  07/30/19 124/80  01/26/19 131/86  10/18/18 124/82   Wt Readings from Last 3 Encounters:  07/30/19 139 lb (63 kg)  01/26/19 137 lb (62.1 kg)  10/18/18 128 lb (58.1 kg)      Physical Exam Constitutional:      Appearance: Normal appearance.  HENT:     Head: Normocephalic and atraumatic.  Cardiovascular:     Rate and Rhythm: Normal rate and regular rhythm.     Pulses: Normal pulses.     Heart sounds: Normal heart sounds.  Pulmonary:     Effort: Pulmonary effort is normal.     Breath sounds: Normal breath sounds.  Abdominal:     General: Bowel sounds are normal. There is no distension.      Palpations: Abdomen is soft. There is no mass.  Musculoskeletal:     Cervical back: Normal range of motion and neck supple. No rigidity or tenderness.     Right lower leg: No edema.     Left lower leg: No edema.  Lymphadenopathy:     Cervical: No cervical adenopathy.  Skin:    General: Skin is warm and dry.     Capillary Refill: Capillary refill takes less than 2 seconds.  Neurological:     General: No focal deficit present.     Mental Status: She is alert and oriented to person, place, and time.  Psychiatric:        Mood and Affect: Mood normal.        Behavior: Behavior normal.      No results found for any visits on 07/30/19.  Assessment & Plan     Problem List Items Addressed This Visit      Cardiovascular and Mediastinum   Essential hypertension, benign - Primary    Pt with history of HTN that is well controlled on medication Pt is currently taking tiramterene-HCTZ 37.5-25mg  BP today in office is 124/80 ASCVD risk is 6.9%  Plan: Discussed with pt the importance of diet and exercise for healthy weight maintenance Will continue medication as prescribed Will check CBC Will check Lipid Panel Will consider statin in the future      Relevant Orders   Comprehensive Metabolic Panel (CMET)   Lipid Profile     Digestive   Esophagitis, reflux    Pt with history of GERD that is well controled on medication Pt is taking omeprazole 20mg  daily Pt with very rare symptoms  Plan: Will continue taking medications as prescribed         Other   Insomnia    Pt with history of insomnia that is controlled on klonopin Pt is on chronic benzodiazepines  for symptom of insomnia  Plan Discussed with pt the long term side-effects of chronic benzo use Will start taking trazodone 100mg  instead of klonopin       Relevant Medications   traZODone (DESYREL) 100 MG tablet   Pure hypercholesterolemia    Pt with hypercholesterolemia Asymptomatic ASCVD risk is  6.9  Plan: Discussed with pt the importance of diet and exercise for healthy weight maintenance Will check lipid panel today           No follow-ups on file.      Chipper Herb, Medical Student [facility] [place] [phone] (phone) [phone] (fax)  [place]

## 2019-07-31 ENCOUNTER — Telehealth: Payer: Self-pay

## 2019-07-31 LAB — COMPREHENSIVE METABOLIC PANEL
ALT: 24 IU/L (ref 0–32)
AST: 22 IU/L (ref 0–40)
Albumin/Globulin Ratio: 2.4 — ABNORMAL HIGH (ref 1.2–2.2)
Albumin: 4.5 g/dL (ref 3.8–4.8)
Alkaline Phosphatase: 53 IU/L (ref 48–121)
BUN/Creatinine Ratio: 17 (ref 12–28)
BUN: 16 mg/dL (ref 8–27)
Bilirubin Total: 0.3 mg/dL (ref 0.0–1.2)
CO2: 24 mmol/L (ref 20–29)
Calcium: 9.8 mg/dL (ref 8.7–10.3)
Chloride: 104 mmol/L (ref 96–106)
Creatinine, Ser: 0.92 mg/dL (ref 0.57–1.00)
GFR calc Af Amer: 75 mL/min/{1.73_m2} (ref >59)
GFR calc non Af Amer: 65 mL/min/{1.73_m2} (ref >59)
Globulin, Total: 1.9 g/dL (ref 1.5–4.5)
Glucose: 97 mg/dL (ref 65–99)
Potassium: 3.6 mmol/L (ref 3.5–5.2)
Sodium: 143 mmol/L (ref 134–144)
Total Protein: 6.4 g/dL (ref 6.0–8.5)

## 2019-07-31 LAB — LIPID PANEL
Chol/HDL Ratio: 2.6 ratio (ref 0.0–4.4)
Cholesterol, Total: 190 mg/dL (ref 100–199)
HDL: 74 mg/dL (ref >39)
LDL Chol Calc (NIH): 101 mg/dL — ABNORMAL HIGH (ref 0–99)
Triglycerides: 85 mg/dL (ref 0–149)
VLDL Cholesterol Cal: 15 mg/dL (ref 5–40)

## 2019-07-31 NOTE — Telephone Encounter (Signed)
-----   Message from Virginia Crews, MD sent at 07/31/2019  8:52 AM EDT ----- Normal labs.  Improvement in cholesterol.  I recommend diet low in saturated fat and regular exercise - 30 min at least 5 times per week

## 2019-07-31 NOTE — Telephone Encounter (Signed)
Pt advised.   Thanks,   -Cadan Maggart  

## 2019-12-06 IMAGING — MG DIGITAL DIAGNOSTIC BILAT W/ TOMO W/ CAD
8 series · 8 of 24 positions shown · non-contrast
Comparison: Previous exam(s).

CLINICAL DATA: Short-term follow-up for a probably benign mass in
the left breast, initially noted on a screening exam dated
02/16/2017.

EXAM:
DIGITAL DIAGNOSTIC BILATERAL MAMMOGRAM WITH CAD AND TOMO
ULTRASOUND LEFT BREAST

[R CC synth-2D]
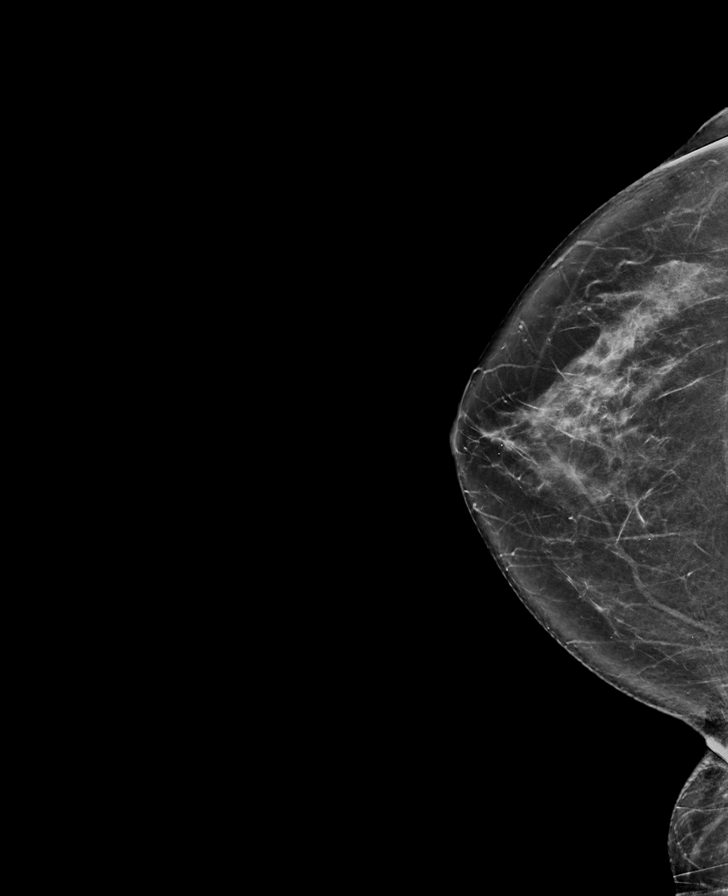

[R MLO synth-2D]
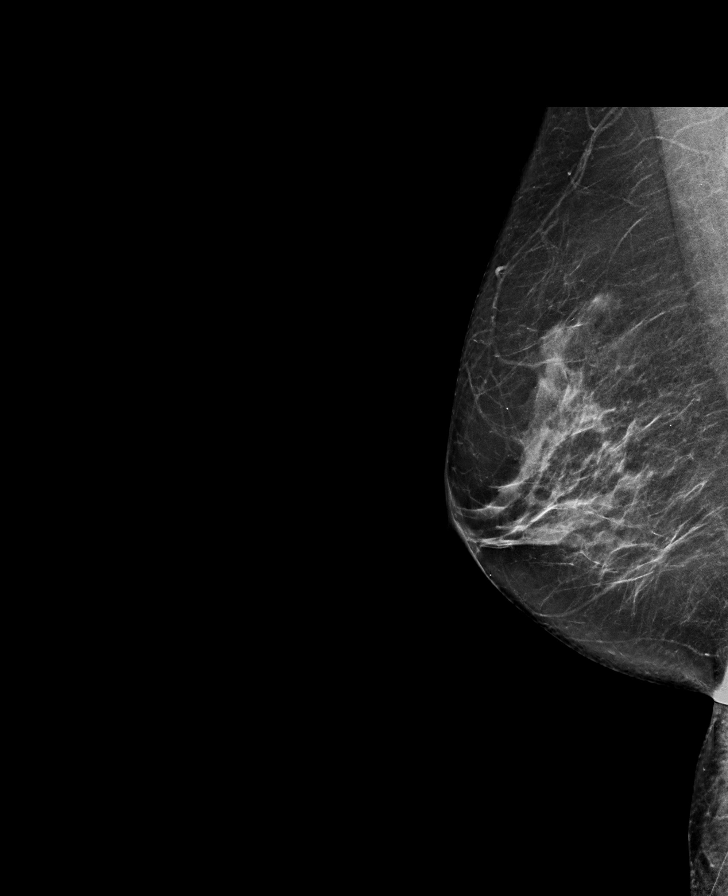

[L CC synth-2D]
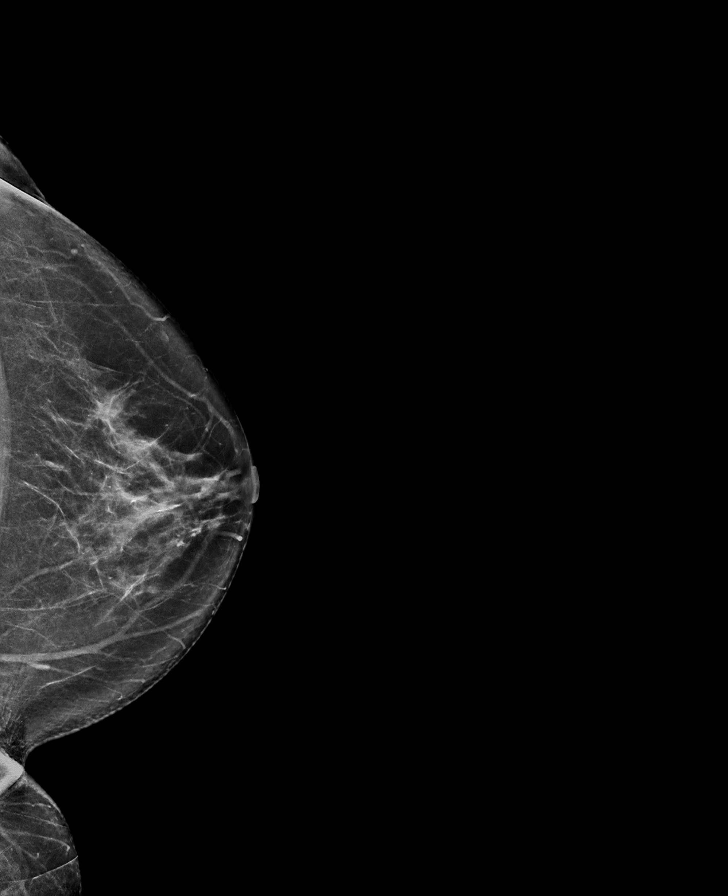

[L MLO synth-2D]
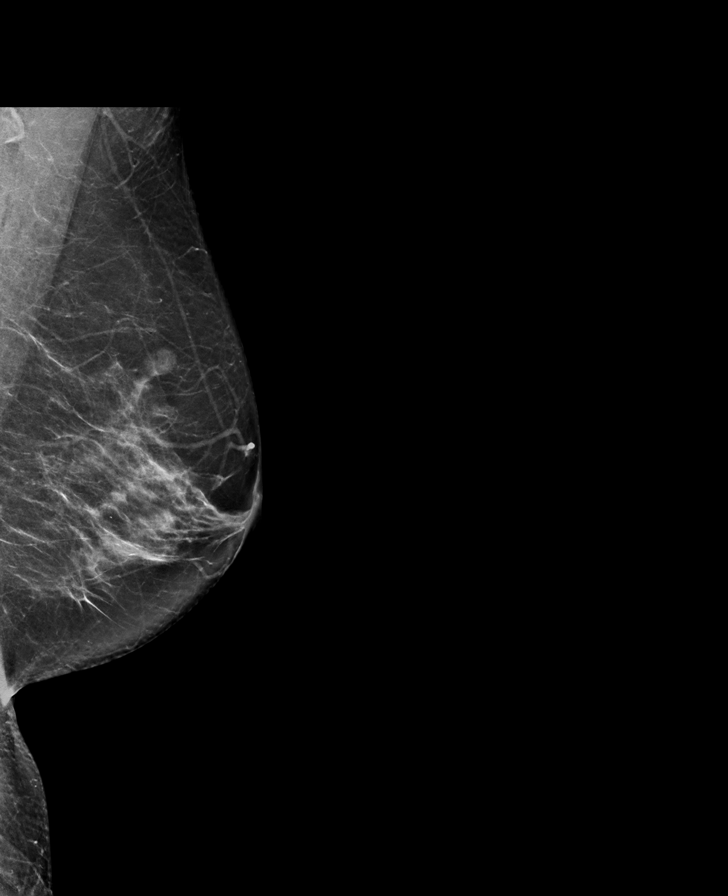

[R MLO tomo · tomo slice 35/69.0]
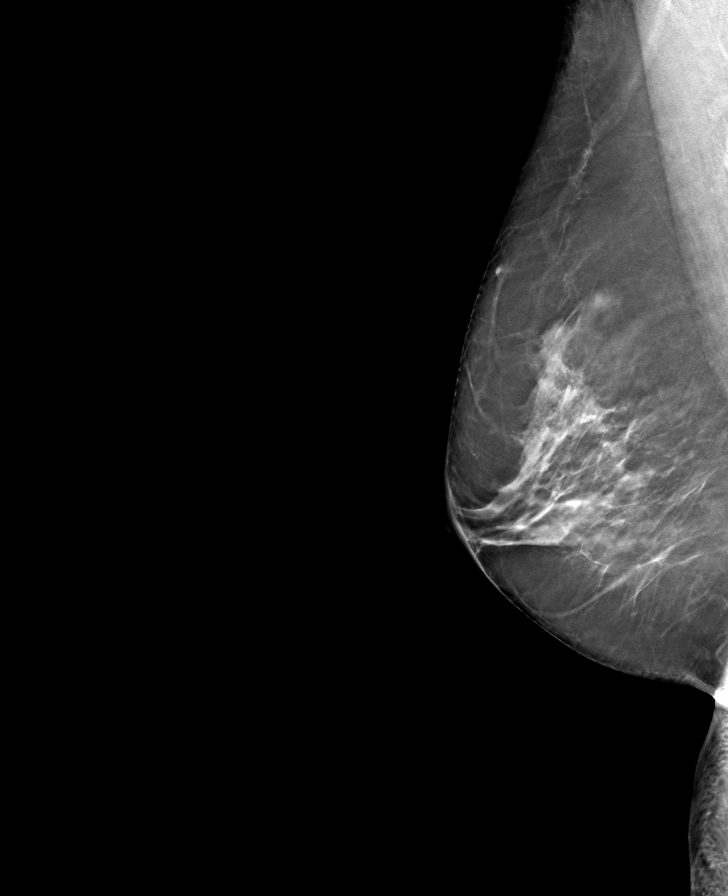

[L CC tomo · tomo slice 37/72.0]
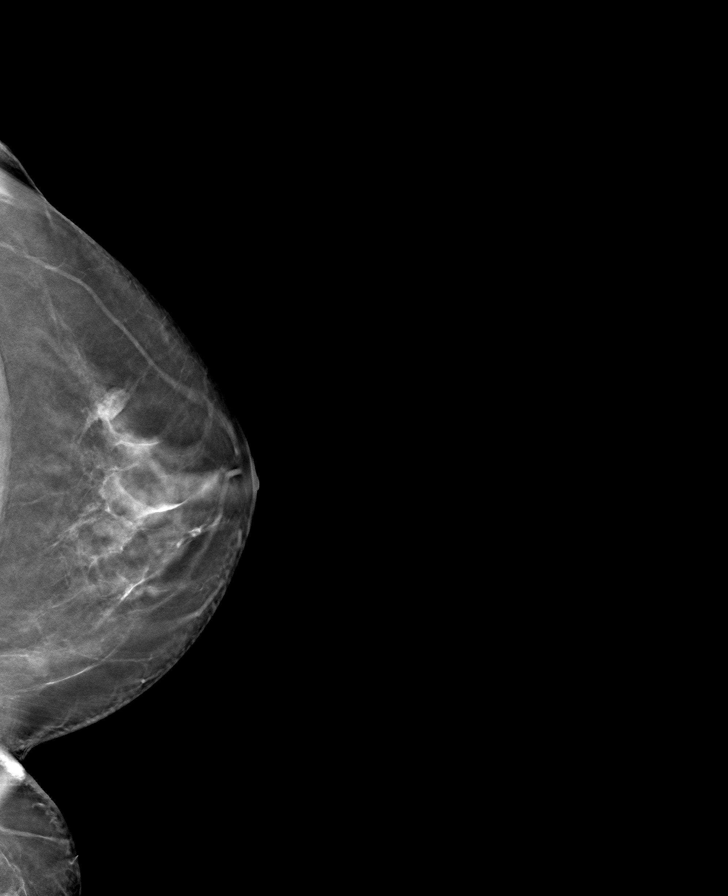

[R CC tomo · tomo slice 37/74.0]
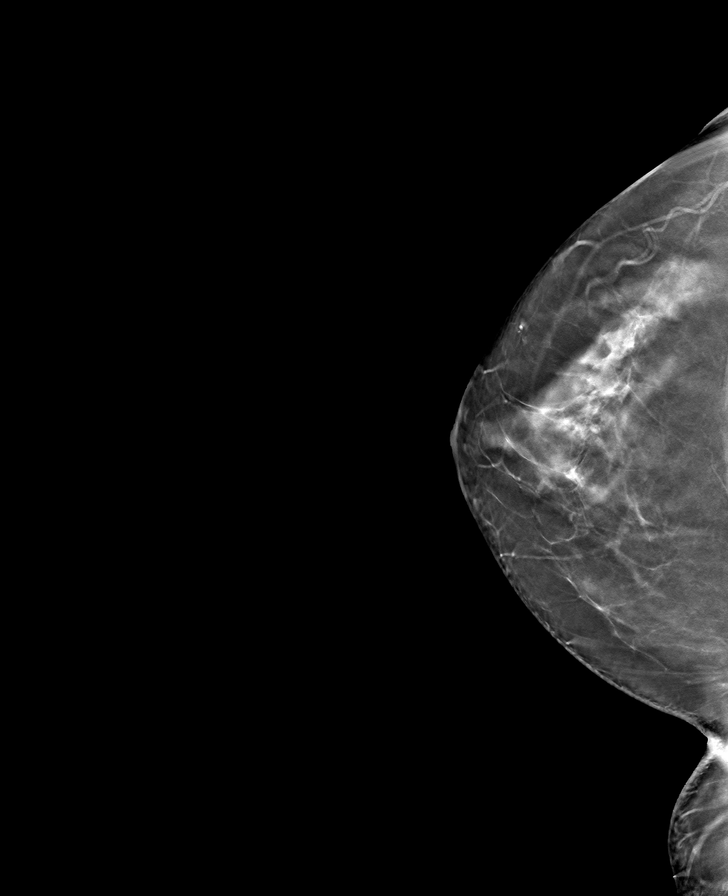

[L MLO tomo · tomo slice 36/71.0]
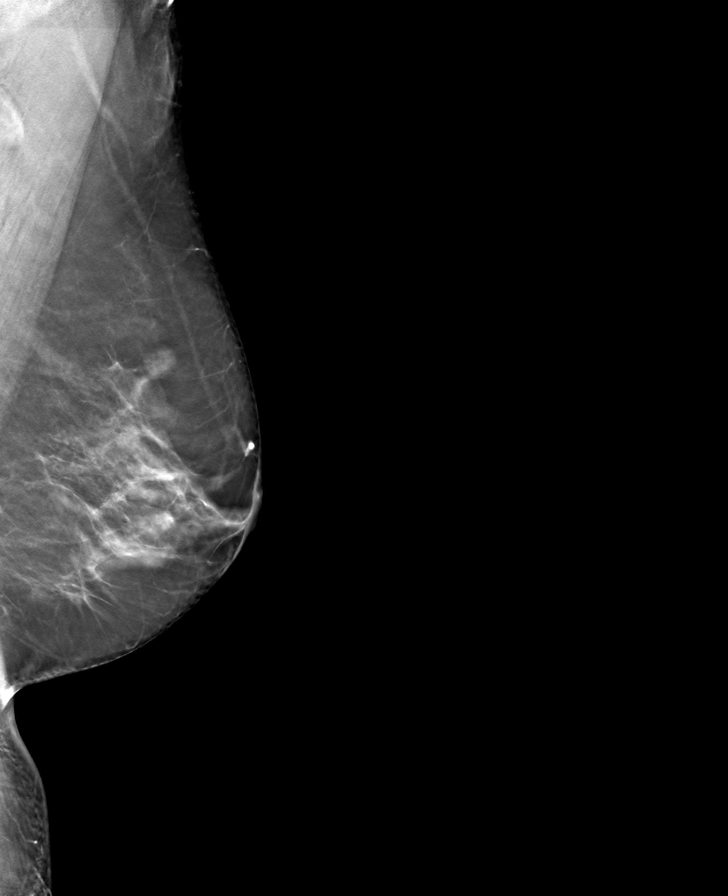

[8 of 24 positions shown; findings below may reference images not displayed]

ACR Breast Density Category c: The breast tissue is heterogeneously
dense, which may obscure small masses.
FINDINGS: The oval mass noted in the upper outer left breast is stable.

There are no new masses, no areas of architectural distortion, no
areas of significant asymmetry and no suspicious calcifications.

Mammographic images were processed with CAD.

Targeted ultrasound is performed, showing an oval hypoechoic
circumscribed mass in the left breast at 2 o'clock, 2 cm the nipple,
measuring 9 x 3 x 7 mm, unchanged from the prior studies.
IMPRESSION: 1. No evidence of breast malignancy.
2. Benign mass in the upper outer left breast, stable for nearly 2
years. This is likely a fibroadenoma.

RECOMMENDATION:
Screening mammogram in one year.(Code:U5-S-UM7)

I have discussed the findings and recommendations with the patient.
If applicable, a reminder letter will be sent to the patient
regarding the next appointment.

BI-RADS CATEGORY  2: Benign.

## 2019-12-18 ENCOUNTER — Other Ambulatory Visit: Payer: Self-pay | Admitting: Family Medicine

## 2019-12-18 DIAGNOSIS — K2100 Gastro-esophageal reflux disease with esophagitis, without bleeding: Secondary | ICD-10-CM

## 2020-01-21 ENCOUNTER — Other Ambulatory Visit: Payer: Self-pay | Admitting: Family Medicine

## 2020-01-21 DIAGNOSIS — I10 Essential (primary) hypertension: Secondary | ICD-10-CM

## 2020-01-23 ENCOUNTER — Other Ambulatory Visit: Payer: Self-pay

## 2020-01-23 ENCOUNTER — Ambulatory Visit (INDEPENDENT_AMBULATORY_CARE_PROVIDER_SITE_OTHER): Payer: 59

## 2020-01-23 DIAGNOSIS — Z23 Encounter for immunization: Secondary | ICD-10-CM

## 2020-01-29 ENCOUNTER — Encounter: Payer: Self-pay | Admitting: Family Medicine

## 2020-01-29 ENCOUNTER — Other Ambulatory Visit: Payer: Self-pay

## 2020-01-29 ENCOUNTER — Ambulatory Visit (INDEPENDENT_AMBULATORY_CARE_PROVIDER_SITE_OTHER): Payer: 59 | Admitting: Family Medicine

## 2020-01-29 VITALS — BP 126/82 | HR 64 | Temp 98.3°F | Resp 16 | Ht 60.0 in | Wt 141.9 lb

## 2020-01-29 DIAGNOSIS — R102 Pelvic and perineal pain: Secondary | ICD-10-CM | POA: Diagnosis not present

## 2020-01-29 DIAGNOSIS — F5101 Primary insomnia: Secondary | ICD-10-CM

## 2020-01-29 DIAGNOSIS — G4700 Insomnia, unspecified: Secondary | ICD-10-CM

## 2020-01-29 DIAGNOSIS — Z0000 Encounter for general adult medical examination without abnormal findings: Secondary | ICD-10-CM | POA: Diagnosis not present

## 2020-01-29 DIAGNOSIS — I10 Essential (primary) hypertension: Secondary | ICD-10-CM

## 2020-01-29 DIAGNOSIS — Z1231 Encounter for screening mammogram for malignant neoplasm of breast: Secondary | ICD-10-CM | POA: Diagnosis not present

## 2020-01-29 MED ORDER — CLONAZEPAM 0.5 MG PO TABS: 0.5000 mg | ORAL_TABLET | Freq: Every evening | ORAL | 1 refills | Status: DC | PRN

## 2020-01-29 MED ORDER — TRIAMTERENE-HCTZ 37.5-25 MG PO CAPS: ORAL_CAPSULE | ORAL | 3 refills | Status: DC

## 2020-01-29 NOTE — Assessment & Plan Note (Signed)
Longstanding problem Previously well controlled on Klonopin No relief with trazodone and it caused dizziness Reports melatonin causes headaches Discussed concerns with long-term benzos, especially in the elderly Patient understands the risks of benzo use She agrees to lowest dose and most infrequent use of Klonopin possible She does not want to try different medication at this time We discussed plan to avoid dose titration and increasing number of pills per month We will use Klonopin 0.5 mg nightly as needed and will be given 45 pills for 90-day supply as she will not be using this every day We will continue to monitor

## 2020-01-29 NOTE — Assessment & Plan Note (Signed)
Well controlled Continue current medications Recheck metabolic panel F/u in 6 months  

## 2020-01-29 NOTE — Progress Notes (Signed)
Complete physical exam   Patient: Danielle Ortega   DOB: [birth year]   64 y.o. Female  MRN: [id number] Visit Date: 01/29/2020  Today's healthcare provider: Lavon Paganini, MD   Chief Complaint  Patient presents with  . Annual Exam   Subjective    Danielle Ortega is a 64 y.o. female who presents today for a complete physical exam.  She reports consuming a general diet. The patient does not participate in regular exercise at present. She generally feels well. She reports sleeping fairly well. She does not have additional problems to discuss today.  HPI  12/27/2016 Pap/HPV-negative 01/11/2019 Mammogram-BI-RADS 2 11/14/2018 BMD-Osteopenia 01/12/2018 Colonoscopy-polyps, pathology COLON POLYPS X2, DESCENDING AND SIGMOID; COLD BIOPSIES:  - ONE FRAGMENT DISPLAYING POLYPOID LESION WITH SUPERFICIAL SERRATED  CHANGES.  - TWO FRAGMENTS OF UNREMARKABLE COLONIC MUCOSA.  - NO EVIDENCE OF HIGH-GRADE DYSPLASIA OR MALIGNANCY.   Reports right lower quadrant/pelvic pain intermittently over the last 10 to 20 years.  She has not found anything that seems to make it worse or better.  It is colicky and crampy in nature when present.  She denies any hip/groin pain.  No vaginal bleeding.  She has never had this worked up before.  She continues to have trouble with insomnia.  She states she has been off Klonopin for about 4 months, but continues to have some sleepless nights and other nights where she wakes up frequently.  She tried trazodone with no relief and it caused some dizziness.  States melatonin causes headaches.   Past Medical History:  Diagnosis Date  . Allergy   . Complication of anesthesia    extreme nausea with shoulder surgery  . Hypertension   . PONV (postoperative nausea and vomiting)    Past Surgical History:  Procedure Laterality Date  . COLONOSCOPY WITH PROPOFOL N/A 01/12/2018   Procedure: COLONOSCOPY WITH PROPOFOL;  Surgeon: Virgel Manifold, MD;  Location: [facility]  ENDOSCOPY;  Service: Endoscopy;  Laterality: N/A;  . SHOULDER ARTHROSCOPY Left   . TONSILLECTOMY     Social History   Socioeconomic History  . Marital status: Married    Spouse name: [name]  . Number of children: 0  . Years of education: 63  . Highest education level: Not on file  Occupational History  . Not on file  Tobacco Use  . Smoking status: Never Smoker  . Smokeless tobacco: Never Used  Vaping Use  . Vaping Use: Never used  Substance and Sexual Activity  . Alcohol use: Yes    Alcohol/week: 2.0 standard drinks    Types: 2 Shots of liquor per week  . Drug use: No  . Sexual activity: Not Currently  Other Topics Concern  . Not on file  Social History Narrative  . Not on file   Social Determinants of Health   Financial Resource Strain:   . Difficulty of Paying Living Expenses: Not on file  Food Insecurity:   . Worried About Charity fundraiser in the Last Year: Not on file  . Ran Out of Food in the Last Year: Not on file  Transportation Needs:   . Lack of Transportation (Medical): Not on file  . Lack of Transportation (Non-Medical): Not on file  Physical Activity:   . Days of Exercise per Week: Not on file  . Minutes of Exercise per Session: Not on file  Stress:   . Feeling of Stress : Not on file  Social Connections:   . Frequency of Communication with  Friends and Family: Not on file  . Frequency of Social Gatherings with Friends and Family: Not on file  . Attends Religious Services: Not on file  . Active Member of Clubs or Organizations: Not on file  . Attends Archivist Meetings: Not on file  . Marital Status: Not on file  Intimate Partner Violence:   . Fear of Current or Ex-Partner: Not on file  . Emotionally Abused: Not on file  . Physically Abused: Not on file  . Sexually Abused: Not on file   Family Status  Relation Name Status  . Mother  Deceased  . Father  Deceased  . Brother  Alive  . Neg Hx  (Not Specified)   Family History   Problem Relation Age of Onset  . Lung cancer Mother   . Hypertension Father   . Heart attack Father   . Breast cancer Neg Hx    Allergies  Allergen Reactions  . Hydrocodone Other (See Comments)    Dizziness and ineffective    Patient Care Team: Virginia Crews, MD as PCP - General (Family Medicine)   Medications: Outpatient Medications Prior to Visit  Medication Sig  . Ascorbic Acid (VITAMIN C) 1000 MG tablet Take by mouth.  Marland Kitchen aspirin 81 MG tablet Take 1 tablet (81 mg total) by mouth daily.  . Biotin 5000 MCG CAPS Take by mouth.  . cetirizine (ZYRTEC) 10 MG tablet Take 1 tablet (10 mg total) by mouth daily.  . Cranberry 425 MG CAPS Take by mouth.  Javier Docker Oil 1000 MG CAPS Take by mouth.  . Magnesium 125 MG CAPS Take 1 capsule by mouth in the morning and at bedtime.  . Multiple Vitamin (MULTIVITAMIN) tablet Take 1 tablet by mouth daily.  Marland Kitchen VITAMIN E PO Take 16,000 Units daily by mouth.  . [DISCONTINUED] traZODone (DESYREL) 100 MG tablet Take 0.5-1 tablets (50-100 mg total) by mouth at bedtime.  . [DISCONTINUED] triamterene-hydrochlorothiazide (DYAZIDE) 37.5-25 MG capsule TAKE 1 CAPSULE BY MOUTH  DAILY  . [DISCONTINUED] clonazePAM (KLONOPIN) 1 MG tablet TAKE 1/2 TO 1 TABLET BY  MOUTH AT BEDTIME FOR  INSOMNIA. (Patient not taking: Reported on 01/29/2020)  . [DISCONTINUED] omeprazole (PRILOSEC) 20 MG capsule TAKE 1 CAPSULE BY MOUTH  DAILY (Patient not taking: Reported on 01/29/2020)   No facility-administered medications prior to visit.    Review of Systems  Constitutional: Negative.   HENT: Negative.   Eyes: Negative.   Respiratory: Negative.   Cardiovascular: Negative.   Gastrointestinal: Negative.   Endocrine: Negative.   Genitourinary: Negative.   Musculoskeletal: Negative.   Skin: Negative.   Allergic/Immunologic: Negative.   Neurological: Negative.   Hematological: Negative.   Psychiatric/Behavioral: Negative.     Last CBC Lab Results  Component Value Date     WBC 4.0 12/29/2016   HGB 14.7 12/29/2016   HCT 43.4 12/29/2016   MCV 90 12/29/2016   MCH 30.4 12/29/2016   RDW 14.1 12/29/2016   PLT 174 09/73/5329   Last metabolic panel Lab Results  Component Value Date   GLUCOSE 97 07/30/2019   NA 143 07/30/2019   K 3.6 07/30/2019   CL 104 07/30/2019   CO2 24 07/30/2019   BUN 16 07/30/2019   CREATININE 0.92 07/30/2019   GFRNONAA 65 07/30/2019   GFRAA 75 07/30/2019   CALCIUM 9.8 07/30/2019   PROT 6.4 07/30/2019   ALBUMIN 4.5 07/30/2019   LABGLOB 1.9 07/30/2019   AGRATIO 2.4 (H) 07/30/2019   BILITOT 0.3 07/30/2019  ALKPHOS 53 07/30/2019   AST 22 07/30/2019   ALT 24 07/30/2019   Last lipids Lab Results  Component Value Date   CHOL 190 07/30/2019   HDL 74 07/30/2019   LDLCALC 101 (H) 07/30/2019   TRIG 85 07/30/2019   CHOLHDL 2.6 07/30/2019   Last hemoglobin A1c Lab Results  Component Value Date   HGBA1C 5.5 09/30/2017   Last thyroid functions Lab Results  Component Value Date   TSH 3.220 12/29/2016   Last vitamin D Lab Results  Component Value Date   VD25OH 184.4 (H) 12/29/2016   Last vitamin B12 and Folate No results found for: VITAMINB12, FOLATE    Objective    BP 126/82 (BP Location: Left Arm, Patient Position: Sitting, Cuff Size: Normal)   Pulse 64   Temp 98.3 F (36.8 C) (Oral)   Resp 16   Ht 5' (1.524 m)   Wt 141 lb 14.4 oz (64.4 kg)   SpO2 97%   BMI 27.71 kg/m  BP Readings from Last 3 Encounters:  01/29/20 126/82  07/30/19 124/80  01/26/19 131/86   Wt Readings from Last 3 Encounters:  01/29/20 141 lb 14.4 oz (64.4 kg)  07/30/19 139 lb (63 kg)  01/26/19 137 lb (62.1 kg)      Physical Exam Vitals reviewed.  Constitutional:      General: She is not in acute distress.    Appearance: Normal appearance. She is well-developed. She is not diaphoretic.  HENT:     Head: Normocephalic and atraumatic.     Right Ear: Tympanic membrane, ear canal and external ear normal.     Left Ear: Tympanic  membrane, ear canal and external ear normal.  Eyes:     General: No scleral icterus.    Conjunctiva/sclera: Conjunctivae normal.     Pupils: Pupils are equal, round, and reactive to light.  Neck:     Thyroid: No thyromegaly.  Cardiovascular:     Rate and Rhythm: Normal rate and regular rhythm.     Pulses: Normal pulses.     Heart sounds: Normal heart sounds. No murmur heard.   Pulmonary:     Effort: Pulmonary effort is normal. No respiratory distress.     Breath sounds: Normal breath sounds. No wheezing or rales.  Chest:     Comments: Breasts: breasts appear normal, no suspicious masses, no skin or nipple changes or axillary nodes.  Abdominal:     General: There is no distension.     Palpations: Abdomen is soft.     Tenderness: There is no abdominal tenderness.  Musculoskeletal:        General: No deformity.     Cervical back: Neck supple.     Right lower leg: No edema.     Left lower leg: No edema.  Lymphadenopathy:     Cervical: No cervical adenopathy.  Skin:    General: Skin is warm and dry.     Findings: No rash.  Neurological:     Mental Status: She is alert and oriented to person, place, and time. Mental status is at baseline.     Sensory: No sensory deficit.     Motor: No weakness.     Gait: Gait normal.  Psychiatric:        Mood and Affect: Mood normal.        Behavior: Behavior normal.        Thought Content: Thought content normal.       Last depression screening scores PHQ 2/9 Scores 01/29/2020  01/26/2019 12/28/2017  PHQ - 2 Score 0 1 0  PHQ- 9 Score 1 2 -   Last fall risk screening Fall Risk  01/29/2020  Falls in the past year? 0  Number falls in past yr: 0  Injury with Fall? 0  Risk for fall due to : No Fall Risks  Follow up Falls evaluation completed   Last Audit-C alcohol use screening Alcohol Use Disorder Test (AUDIT) 01/29/2020  1. How often do you have a drink containing alcohol? 2  2. How many drinks containing alcohol do you have on a typical  day when you are drinking? 0  3. How often do you have six or more drinks on one occasion? 0  AUDIT-C Score 2  4. How often during the last year have you found that you were not able to stop drinking once you had started? -  5. How often during the last year have you failed to do what was normally expected from you because of drinking? -  6. How often during the last year have you needed a first drink in the morning to get yourself going after a heavy drinking session? -  7. How often during the last year have you had a feeling of guilt of remorse after drinking? -  8. How often during the last year have you been unable to remember what happened the night before because you had been drinking? -  9. Have you or someone else been injured as a result of your drinking? -  10. Has a relative or friend or a doctor or another health worker been concerned about your drinking or suggested you cut down? -  Alcohol Use Disorder Identification Test Final Score (AUDIT) -  Alcohol Brief Interventions/Follow-up AUDIT Score <7 follow-up not indicated   A score of 3 or more in women, and 4 or more in men indicates increased risk for alcohol abuse, EXCEPT if all of the points are from question 1   No results found for any visits on 01/29/20.  Assessment & Plan    Routine Health Maintenance and Physical Exam  Exercise Activities and Dietary recommendations Goals   None     Immunization History  Administered Date(s) Administered  . Fluad Quad(high Dose 65+) 10/18/2018  . Influenza,inj,Quad PF,6+ Mos 12/27/2016, 12/28/2017, 01/23/2020  . PFIZER SARS-COV-2 Vaccination 04/19/2019, 05/10/2019, 01/23/2020  . Pneumococcal Conjugate-13 01/26/2019  . Tdap 09/10/2011  . Zoster Recombinat (Shingrix) 01/26/2019, 03/30/2019    Health Maintenance  Topic Date Due  . PNA vac Low Risk Adult (2 of 2 - PPSV23) 01/26/2020  . MAMMOGRAM  01/10/2021  . TETANUS/TDAP  09/09/2021  . COLONOSCOPY  01/13/2023  . INFLUENZA  VACCINE  Completed  . DEXA SCAN  Completed  . COVID-19 Vaccine  Completed  . Hepatitis C Screening  Completed    Discussed health benefits of physical activity, and encouraged her to engage in regular exercise appropriate for her age and condition.  Problem List Items Addressed This Visit      Cardiovascular and Mediastinum   Essential hypertension, benign    Well controlled Continue current medications Recheck metabolic panel F/u in 6 months       Relevant Medications   triamterene-hydrochlorothiazide (DYAZIDE) 37.5-25 MG capsule   Other Relevant Orders   Basic Metabolic Panel (BMET)     Other   Insomnia    Longstanding problem Previously well controlled on Klonopin No relief with trazodone and it caused dizziness Reports melatonin causes headaches Discussed concerns with  long-term benzos, especially in the elderly Patient understands the risks of benzo use She agrees to lowest dose and most infrequent use of Klonopin possible She does not want to try different medication at this time We discussed plan to avoid dose titration and increasing number of pills per month We will use Klonopin 0.5 mg nightly as needed and will be given 45 pills for 90-day supply as she will not be using this every day We will continue to monitor      Relevant Medications   clonazePAM (KLONOPIN) 0.5 MG tablet   Pelvic pain    Longstanding and intermittent issue, but newly discussed No abnormalities on abdominal exam today No postmenopausal bleeding or STD risk Patient is postmenopausal We will get pelvic ultrasound to further evaluate uterus and ovaries and ensure no structural mass or other issue.      Relevant Orders   US Pelvic Complete With Transvaginal    Other Visit Diagnoses    Encounter for annual physical exam    -  Primary   Relevant Orders   MM 3D SCREEN BREAST BILATERAL   Basic Metabolic Panel (BMET)   Encounter for screening mammogram for malignant neoplasm of breast        Relevant Orders   MM 3D SCREEN BREAST BILATERAL       Return in about 6 months (around 07/29/2020) for chronic disease f/u (fasting).     I, Lavon Paganini, MD, have reviewed all documentation for this visit. The documentation on 01/29/20 for the exam, diagnosis, procedures, and orders are all accurate and complete.   Massey Ruhland, [name], MD, MPH [facility]

## 2020-01-29 NOTE — Patient Instructions (Addendum)
You are due for second pneumonia shot (pneumovax). Plan to get this about a month after flu and COVID shots.  Come fasting to next appointment  Preventive Care 65 Years and Older, Female Preventive care refers to lifestyle choices and visits with your health care provider that can promote health and wellness. This includes:  A yearly physical exam. This is also called an annual well check.  Regular dental and eye exams.  Immunizations.  Screening for certain conditions.  Healthy lifestyle choices, such as diet and exercise. What can I expect for my preventive care visit? Physical exam Your health care provider will check:  Height and weight. These may be used to calculate body mass index (BMI), which is a measurement that tells if you are at a healthy weight.  Heart rate and blood pressure.  Your skin for abnormal spots. Counseling Your health care provider may ask you questions about:  Alcohol, tobacco, and drug use.  Emotional well-being.  Home and relationship well-being.  Sexual activity.  Eating habits.  History of falls.  Memory and ability to understand (cognition).  Work and work Statistician.  Pregnancy and menstrual history. What immunizations do I need?  Influenza (flu) vaccine  This is recommended every year. Tetanus, diphtheria, and pertussis (Tdap) vaccine  You may need a Td booster every 10 years. Varicella (chickenpox) vaccine  You may need this vaccine if you have not already been vaccinated. Zoster (shingles) vaccine  You may need this after age 27. Pneumococcal conjugate (PCV13) vaccine  One dose is recommended after age 67. Pneumococcal polysaccharide (PPSV23) vaccine  One dose is recommended after age 12. Measles, mumps, and rubella (MMR) vaccine  You may need at least one dose of MMR if you were born in 1957 or later. You may also need a second dose. Meningococcal conjugate (MenACWY) vaccine  You may need this if you have  certain conditions. Hepatitis A vaccine  You may need this if you have certain conditions or if you travel or work in places where you may be exposed to hepatitis A. Hepatitis B vaccine  You may need this if you have certain conditions or if you travel or work in places where you may be exposed to hepatitis B. Haemophilus influenzae type b (Hib) vaccine  You may need this if you have certain conditions. You may receive vaccines as individual doses or as more than one vaccine together in one shot (combination vaccines). Talk with your health care provider about the risks and benefits of combination vaccines. What tests do I need? Blood tests  Lipid and cholesterol levels. These may be checked every 5 years, or more frequently depending on your overall health.  Hepatitis C test.  Hepatitis B test. Screening  Lung cancer screening. You may have this screening every year starting at age 52 if you have a 30-pack-year history of smoking and currently smoke or have quit within the past 15 years.  Colorectal cancer screening. All adults should have this screening starting at age 76 and continuing until age 80. Your health care provider may recommend screening at age 13 if you are at increased risk. You will have tests every 1-10 years, depending on your results and the type of screening test.  Diabetes screening. This is done by checking your blood sugar (glucose) after you have not eaten for a while (fasting). You may have this done every 1-3 years.  Mammogram. This may be done every 1-2 years. Talk with your health care provider about how  often you should have regular mammograms.  BRCA-related cancer screening. This may be done if you have a family history of breast, ovarian, tubal, or peritoneal cancers. Other tests  Sexually transmitted disease (STD) testing.  Bone density scan. This is done to screen for osteoporosis. You may have this done starting at age 38. Follow these  instructions at home: Eating and drinking  Eat a diet that includes fresh fruits and vegetables, whole grains, lean protein, and low-fat dairy products. Limit your intake of foods with high amounts of sugar, saturated fats, and salt.  Take vitamin and mineral supplements as recommended by your health care provider.  Do not drink alcohol if your health care provider tells you not to drink.  If you drink alcohol: ? Limit how much you have to 0-1 drink a day. ? Be aware of how much alcohol is in your drink. In the [place] one drink equals one 12 oz bottle of beer (355 mL), one 5 oz glass of wine (148 mL), or one 1 oz glass of hard liquor (44 mL). Lifestyle  Take daily care of your teeth and gums.  Stay active. Exercise for at least 30 minutes on 5 or more days each week.  Do not use any products that contain nicotine or tobacco, such as cigarettes, e-cigarettes, and chewing tobacco. If you need help quitting, ask your health care provider.  If you are sexually active, practice safe sex. Use a condom or other form of protection in order to prevent STIs (sexually transmitted infections).  Talk with your health care provider about taking a low-dose aspirin or statin. What's next?  Go to your health care provider once a year for a well check visit.  Ask your health care provider how often you should have your eyes and teeth checked.  Stay up to date on all vaccines. This information is not intended to replace advice given to you by your health care provider. Make sure you discuss any questions you have with your health care provider. Document Revised: 02/02/2018 Document Reviewed: 02/02/2018 Elsevier Patient Education  2020 [employer].

## 2020-01-29 NOTE — Assessment & Plan Note (Signed)
Longstanding and intermittent issue, but newly discussed No abnormalities on abdominal exam today No postmenopausal bleeding or STD risk Patient is postmenopausal We will get pelvic ultrasound to further evaluate uterus and ovaries and ensure no structural mass or other issue.

## 2020-01-30 LAB — BASIC METABOLIC PANEL
BUN/Creatinine Ratio: 16 (ref 12–28)
BUN: 14 mg/dL (ref 8–27)
CO2: 25 mmol/L (ref 20–29)
Calcium: 10.3 mg/dL (ref 8.7–10.3)
Chloride: 103 mmol/L (ref 96–106)
Creatinine, Ser: 0.9 mg/dL (ref 0.57–1.00)
GFR calc Af Amer: 77 mL/min/{1.73_m2} (ref >59)
GFR calc non Af Amer: 67 mL/min/{1.73_m2} (ref >59)
Glucose: 99 mg/dL (ref 65–99)
Potassium: 4.7 mmol/L (ref 3.5–5.2)
Sodium: 143 mmol/L (ref 134–144)

## 2020-02-04 ENCOUNTER — Ambulatory Visit: Payer: 59 | Attending: Family Medicine

## 2020-02-14 ENCOUNTER — Other Ambulatory Visit: Payer: Self-pay

## 2020-02-14 ENCOUNTER — Ambulatory Visit
Admission: RE | Admit: 2020-02-14 | Discharge: 2020-02-14 | Disposition: A | Discharge status: Home / Self Care | Payer: 59 | Source: Ambulatory Visit | Attending: Family Medicine | Admitting: Family Medicine

## 2020-02-14 DIAGNOSIS — R102 Pelvic and perineal pain: Secondary | ICD-10-CM | POA: Diagnosis present

## 2020-02-25 ENCOUNTER — Encounter: Payer: Self-pay | Admitting: Family Medicine

## 2020-03-03 ENCOUNTER — Ambulatory Visit (INDEPENDENT_AMBULATORY_CARE_PROVIDER_SITE_OTHER): Payer: 59 | Admitting: Family Medicine

## 2020-03-03 ENCOUNTER — Encounter: Payer: Self-pay | Admitting: Family Medicine

## 2020-03-03 ENCOUNTER — Other Ambulatory Visit: Payer: Self-pay

## 2020-03-03 DIAGNOSIS — Z23 Encounter for immunization: Secondary | ICD-10-CM | POA: Diagnosis not present

## 2020-03-03 NOTE — Progress Notes (Signed)
Patient here for pneumococcal vaccination only.  I did not examine the patient.  I did review his medical history, medications, and allergies and vaccine consent form.  CMA gave vaccination. Patient tolerated well.  Virginia Crews, MD, MPH [facility] 03/03/2020 3:02 PM

## 2020-03-05 ENCOUNTER — Encounter: Payer: Self-pay | Admitting: *Deleted

## 2020-03-05 ENCOUNTER — Ambulatory Visit: Payer: Self-pay

## 2020-03-05 ENCOUNTER — Ambulatory Visit: Payer: Self-pay | Admitting: *Deleted

## 2020-03-05 ENCOUNTER — Encounter: Payer: Self-pay | Admitting: Family Medicine

## 2020-03-05 NOTE — Telephone Encounter (Signed)
See previous note

## 2020-03-05 NOTE — Telephone Encounter (Signed)
Called [phone], no answer and left voicemail for patient to call clinic back. Greater than 3 attempts made to contact patient. Will allow patient to call back if advise needed.

## 2020-03-05 NOTE — Telephone Encounter (Signed)
Patient advised as directed below. Per patient she already took Tylenol and will send a picture for [place] to see. Reports that is not hot to touch.

## 2020-03-05 NOTE — Telephone Encounter (Signed)
Is it warm to touch?  Could she send a picture through mychart?  Could be more allergic reaction. Can take benadryl or one of the 24 hr antihistamine medications (zyrtec, allegra, xyzal, claritin) if benadryl makes too drowsy

## 2020-03-05 NOTE — Telephone Encounter (Signed)
Patient called stating that she had a second pneumonia vaccine at office on Monday.  She states that her arm has been painful and she is taking tylenol for the pain.  Today she was shocked to see that her arm around the injection site has big red blotches.  She states they are not warm and there is no noticable swelling.  She does not have fever. Per protocol office was notified for advise. Note will be routed back for follow up.Care advice read to patient  She verbalized understanding  Reason for Disposition . [1] Over 3 days (72 hours) since shot AND [2] redness, swelling or pain getting worse  Answer Assessment - Initial Assessment Questions 1. SYMPTOMS: "What is the main symptom?" (e.g., redness, swelling, pain)      Redness splotches 2. ONSET: "When was the vaccine (shot) given?" "How much later did the blotches  begin?" (e.g., hours, days ago)      Unsure just noticed today 3. SEVERITY: "How bad is it?"      extremly tender and sore 4. FEVER: "Is there a fever?" If Yes, ask: "What is it, how was it measured, and when did it start?"      no 5. IMMUNIZATIONS GIVEN: "What shots have you recently received?"     Pneumonia second dose 6. PAST REACTIONS: "Have you reacted to immunizations before?" If Yes, ask: "What happened?"     no 7. OTHER SYMPTOMS: "Do you have any other symptoms?"     none  Protocols used: IMMUNIZATION REACTIONS-A-AH

## 2020-03-06 NOTE — Telephone Encounter (Signed)
Will watch for mychart message with picture.

## 2020-05-02 ENCOUNTER — Other Ambulatory Visit: Payer: Self-pay | Admitting: Family Medicine

## 2020-05-02 DIAGNOSIS — G4700 Insomnia, unspecified: Secondary | ICD-10-CM

## 2020-05-02 NOTE — Telephone Encounter (Signed)
Requested medication (s) are due for refill today: yes  Requested medication (s) are on the active medication list: yes  Last refill:  01/31/20 #45 1 refill  Future visit scheduled: yes  Notes to clinic:  not delegated per protocol     Requested Prescriptions  Pending Prescriptions Disp Refills   clonazePAM (KLONOPIN) 0.5 MG tablet [Pharmacy Med Name: clonazePAM 0.5 MG Oral Tablet] 45 tablet     Sig: TAKE 1 TABLET BY MOUTH AT  BEDTIME AS NEEDED FOR  ANXIETY      Not Delegated - Psychiatry:  Anxiolytics/Hypnotics Failed - 05/02/2020  5:22 PM      Failed - This refill cannot be delegated      Failed - Urine Drug Screen completed in last 360 days      Passed - Valid encounter within last 6 months    Recent Outpatient Visits           2 months ago Need for vaccination against Streptococcus pneumoniae   [facility] [place], [name], MD   3 months ago Encounter for annual physical exam   [facility] [place], [name], MD   9 months ago Essential hypertension, benign   [facility] [place], [name], MD   1 year ago Need for shingles vaccine   [facility], [name], MD   1 year ago Encounter for annual physical exam   [facility] [name], [name], MD       Future Appointments             In 2 months [name], [name], MD [facility] [place], [place]   In 9 months [name], [name], MD [facility], [place]

## 2020-05-05 NOTE — Telephone Encounter (Signed)
LOV 03/03/20 LRF 12/21

## 2020-07-13 ENCOUNTER — Encounter: Payer: Self-pay | Admitting: Family Medicine

## 2020-07-16 ENCOUNTER — Ambulatory Visit (INDEPENDENT_AMBULATORY_CARE_PROVIDER_SITE_OTHER): Payer: 59

## 2020-07-16 ENCOUNTER — Other Ambulatory Visit: Payer: Self-pay

## 2020-07-16 DIAGNOSIS — Z23 Encounter for immunization: Secondary | ICD-10-CM | POA: Diagnosis not present

## 2020-07-29 ENCOUNTER — Ambulatory Visit: Payer: Self-pay | Admitting: Family Medicine

## 2020-09-11 ENCOUNTER — Other Ambulatory Visit: Payer: Self-pay | Admitting: Family Medicine

## 2020-09-11 NOTE — Telephone Encounter (Signed)
Requested medication (s) are due for refill today - expired Rx  Requested medication (s) are on the active medication list - yes  Future visit scheduled -yes  Last refill: 01/26/19 #90 3RF  Notes to clinic: Request RF- expired Rx  Requested Prescriptions  Pending Prescriptions Disp Refills   cetirizine (ZYRTEC) 10 MG tablet [Pharmacy Med Name: CETIRIZINE  10MG   TAB] 90 tablet 3    Sig: TAKE 1 TABLET BY MOUTH  DAILY      Ear, Nose, and Throat:  Antihistamines Passed - 09/11/2020  3:35 PM      Passed - Valid encounter within last 12 months    Recent Outpatient Visits           6 months ago Need for vaccination against Streptococcus pneumoniae   [facility] [place], [name], MD   7 months ago Encounter for annual physical exam   [facility] [place], [name], MD   1 year ago Essential hypertension, benign   [facility] [place], [name], MD   1 year ago Need for shingles vaccine   [facility], [name], MD   1 year ago Encounter for annual physical exam   [facility] [name], [name], MD       Future Appointments             In 5 days [name], [name], MD [facility], [place]   In 4 months [name], [name], MD [facility]                 Requested Prescriptions  Pending Prescriptions Disp Refills   cetirizine (ZYRTEC) 10 MG tablet [Pharmacy Med Name: CETIRIZINE  10MG   TAB] 90 tablet 3    Sig: TAKE 1 TABLET BY MOUTH  DAILY      Ear, Nose, and Throat:  Antihistamines Passed - 09/11/2020  3:35 PM      Passed - Valid encounter within last 12 months    Recent Outpatient Visits           6 months ago Need for vaccination against Streptococcus pneumoniae   [facility] [place], [name], MD   7 months ago Encounter for annual physical exam   [facility] [place], [name], MD   1 year ago  Essential hypertension, benign   [facility] [place], [name], MD   1 year ago Need for shingles vaccine   [facility], [name], MD   1 year ago Encounter for annual physical exam   [facility] [place], [name], MD       Future Appointments             In 5 days [name], [name], MD [facility], [place]   In 4 months [name], [name], MD [facility], [place]

## 2020-09-16 ENCOUNTER — Ambulatory Visit: Payer: 59 | Admitting: Family Medicine

## 2020-09-16 ENCOUNTER — Other Ambulatory Visit: Payer: Self-pay

## 2020-09-16 ENCOUNTER — Encounter: Payer: Self-pay | Admitting: Family Medicine

## 2020-09-16 VITALS — BP 132/76 | HR 56 | Temp 98.8°F | Wt 136.0 lb

## 2020-09-16 DIAGNOSIS — G4700 Insomnia, unspecified: Secondary | ICD-10-CM

## 2020-09-16 DIAGNOSIS — F5101 Primary insomnia: Secondary | ICD-10-CM

## 2020-09-16 DIAGNOSIS — I10 Essential (primary) hypertension: Secondary | ICD-10-CM

## 2020-09-16 DIAGNOSIS — E7800 Pure hypercholesterolemia, unspecified: Secondary | ICD-10-CM

## 2020-09-16 MED ORDER — TRIAMTERENE-HCTZ 37.5-25 MG PO CAPS: ORAL_CAPSULE | ORAL | 3 refills | Status: DC

## 2020-09-16 MED ORDER — CLONAZEPAM 0.5 MG PO TABS: ORAL_TABLET | ORAL | 1 refills | Status: DC

## 2020-09-16 NOTE — Assessment & Plan Note (Signed)
Longstanding  Well controled on klonopin low dose Did not tolerate trazodone or melatonin Have discussed risks of long-term benzos in the elderly Will not plan to dose escalate

## 2020-09-16 NOTE — Progress Notes (Signed)
Established patient visit   Patient: Danielle Ortega   DOB: [birth year]   65 y.o. Female  MRN: [id number] Visit Date: 09/16/2020  Today's healthcare provider: Lavon Paganini, MD   Chief Complaint  Patient presents with   Follow-up    Subjective    HPI  Hypertension, follow-up  BP Readings from Last 3 Encounters:  09/16/20 132/76  01/29/20 126/82  07/30/19 124/80   Wt Readings from Last 3 Encounters:  09/16/20 136 lb (61.7 kg)  01/29/20 141 lb 14.4 oz (64.4 kg)  07/30/19 139 lb (63 kg)     She was last seen for hypertension 6 months ago.  BP at that visit was 126/82. Management since that visit includes no changes.  She reports good compliance with treatment. She is not having side effects.  She is following a Regular diet. She is not exercising. She does not smoke.  Use of agents associated with hypertension: none.   Outside blood pressures are not being checked.   Pertinent labs: Lab Results  Component Value Date   CHOL 190 07/30/2019   HDL 74 07/30/2019   LDLCALC 101 (H) 07/30/2019   TRIG 85 07/30/2019   CHOLHDL 2.6 07/30/2019   Lab Results  Component Value Date   NA 143 01/29/2020   K 4.7 01/29/2020   CREATININE 0.90 01/29/2020   GFRNONAA 67 01/29/2020   GFRAA 77 01/29/2020   GLUCOSE 99 01/29/2020     The 10-year ASCVD risk score Mikey Bussing DC Jr., et al., 2013) is: 8.7%   ---------------------------------------------------------------------------------------------------  Lipid/Cholesterol, Follow-up  Last lipid panel Other pertinent labs  Lab Results  Component Value Date   CHOL 190 07/30/2019   HDL 74 07/30/2019   LDLCALC 101 (H) 07/30/2019   TRIG 85 07/30/2019   CHOLHDL 2.6 07/30/2019   Lab Results  Component Value Date   ALT 24 07/30/2019   AST 22 07/30/2019   PLT 174 12/29/2016   TSH 3.220 12/29/2016     She was last seen for this 13 months ago.  Management since that visit includes no changes.  She reports good  compliance with treatment.  She is not taking any medications for this.   Symptoms: No chest pain No chest pressure/discomfort  No dyspnea No lower extremity edema  No numbness or tingling of extremity No orthopnea  No palpitations No paroxysmal nocturnal dyspnea  No speech difficulty No syncope   Current diet: in general, a "healthy" diet   Current exercise: none  The 10-year ASCVD risk score Mikey Bussing DC Jr., et al., 2013) is: 8.7%  ---------------------------------------------------------------------------------------------------   Medications: Outpatient Medications Prior to Visit  Medication Sig   Ascorbic Acid (VITAMIN C) 1000 MG tablet Take by mouth.   aspirin 81 MG tablet Take 1 tablet (81 mg total) by mouth daily.   Biotin 5000 MCG CAPS Take by mouth.   cetirizine (ZYRTEC) 10 MG tablet TAKE 1 TABLET BY MOUTH  DAILY   Cranberry 425 MG CAPS Take by mouth.   Krill Oil 1000 MG CAPS Take by mouth.   Magnesium 100 MG TABS Take by mouth.   Multiple Vitamin (MULTIVITAMIN) tablet Take 1 tablet by mouth daily.   VITAMIN E PO Take 16,000 Units daily by mouth.   [DISCONTINUED] clonazePAM (KLONOPIN) 0.5 MG tablet TAKE 1 TABLET BY MOUTH AT  BEDTIME AS NEEDED FOR  ANXIETY   [DISCONTINUED] Magnesium 125 MG CAPS Take 1 capsule by mouth in the morning and at bedtime.   [DISCONTINUED] triamterene-hydrochlorothiazide (DYAZIDE)  37.5-25 MG capsule TAKE 1 CAPSULE BY MOUTH  DAILY   No facility-administered medications prior to visit.    Review of Systems  Constitutional: Negative.   Respiratory: Negative.    Cardiovascular: Negative.   Skin: Negative.   Psychiatric/Behavioral: Negative.        Objective    BP 132/76 (BP Location: Left Arm, Patient Position: Sitting, Cuff Size: Normal)   Pulse (!) 56   Temp 98.8 F (37.1 C) (Oral)   Wt 136 lb (61.7 kg)   SpO2 98%   BMI 26.56 kg/m     Physical Exam Vitals reviewed.  Constitutional:      General: She is not in acute distress.     Appearance: Normal appearance. She is well-developed. She is not diaphoretic.  HENT:     Head: Normocephalic and atraumatic.  Eyes:     General: No scleral icterus.    Conjunctiva/sclera: Conjunctivae normal.  Neck:     Thyroid: No thyromegaly.  Cardiovascular:     Rate and Rhythm: Normal rate and regular rhythm.     Pulses: Normal pulses.     Heart sounds: Normal heart sounds. No murmur heard. Pulmonary:     Effort: Pulmonary effort is normal. No respiratory distress.     Breath sounds: Normal breath sounds. No wheezing, rhonchi or rales.  Musculoskeletal:     Cervical back: Neck supple.     Right lower leg: No edema.     Left lower leg: No edema.  Lymphadenopathy:     Cervical: No cervical adenopathy.  Skin:    General: Skin is warm and dry.     Findings: No rash.  Neurological:     Mental Status: She is alert and oriented to person, place, and time. Mental status is at baseline.  Psychiatric:        Mood and Affect: Mood normal.        Behavior: Behavior normal.      No results found for any visits on 09/16/20.  Assessment & Plan     Problem List Items Addressed This Visit       Cardiovascular and Mediastinum   Essential hypertension, benign - Primary    Well controlled Continue current medications Recheck metabolic panel F/u in 6 months        Relevant Medications   triamterene-hydrochlorothiazide (DYAZIDE) 37.5-25 MG capsule   Other Relevant Orders   Comprehensive metabolic panel     Other   Insomnia    Longstanding  Well controled on klonopin low dose Did not tolerate trazodone or melatonin Have discussed risks of long-term benzos in the elderly Will not plan to dose escalate       Relevant Medications   clonazePAM (KLONOPIN) 0.5 MG tablet   Pure hypercholesterolemia    Reviewed last lipid panel Not currently on a statin Recheck FLP and CMP Discussed diet and exercise        Relevant Medications   triamterene-hydrochlorothiazide  (DYAZIDE) 37.5-25 MG capsule   Other Relevant Orders   Comprehensive metabolic panel   Lipid panel     Return in about 6 months (around 03/19/2021) for CPE, as scheduled.      I, Lavon Paganini, MD, have reviewed all documentation for this visit. The documentation on 09/16/20 for the exam, diagnosis, procedures, and orders are all accurate and complete.   Luby Seamans, [name], MD, MPH [facility]

## 2020-09-16 NOTE — Assessment & Plan Note (Signed)
Well controlled Continue current medications Recheck metabolic panel F/u in 6 months  

## 2020-09-16 NOTE — Assessment & Plan Note (Signed)
Reviewed last lipid panel Not currently on a statin Recheck FLP and CMP Discussed diet and exercise  

## 2020-09-17 ENCOUNTER — Encounter: Payer: Self-pay | Admitting: Family Medicine

## 2020-09-17 LAB — COMPREHENSIVE METABOLIC PANEL
ALT: 20 IU/L (ref 0–32)
AST: 23 IU/L (ref 0–40)
Albumin/Globulin Ratio: 2.8 — ABNORMAL HIGH (ref 1.2–2.2)
Albumin: 5 g/dL — ABNORMAL HIGH (ref 3.8–4.8)
Alkaline Phosphatase: 59 IU/L (ref 44–121)
BUN/Creatinine Ratio: 16 (ref 12–28)
BUN: 15 mg/dL (ref 8–27)
Bilirubin Total: 0.4 mg/dL (ref 0.0–1.2)
CO2: 25 mmol/L (ref 20–29)
Calcium: 10.6 mg/dL — ABNORMAL HIGH (ref 8.7–10.3)
Chloride: 100 mmol/L (ref 96–106)
Creatinine, Ser: 0.94 mg/dL (ref 0.57–1.00)
Globulin, Total: 1.8 g/dL (ref 1.5–4.5)
Glucose: 89 mg/dL (ref 65–99)
Potassium: 4.4 mmol/L (ref 3.5–5.2)
Sodium: 149 mmol/L — ABNORMAL HIGH (ref 134–144)
Total Protein: 6.8 g/dL (ref 6.0–8.5)
eGFR: 67 mL/min/{1.73_m2} (ref >59)

## 2020-09-17 LAB — LIPID PANEL
Chol/HDL Ratio: 2.7 ratio (ref 0.0–4.4)
Cholesterol, Total: 185 mg/dL (ref 100–199)
HDL: 68 mg/dL (ref >39)
LDL Chol Calc (NIH): 103 mg/dL — ABNORMAL HIGH (ref 0–99)
Triglycerides: 76 mg/dL (ref 0–149)
VLDL Cholesterol Cal: 14 mg/dL (ref 5–40)

## 2020-11-20 ENCOUNTER — Other Ambulatory Visit: Payer: Self-pay | Admitting: Family Medicine

## 2020-11-20 DIAGNOSIS — K2100 Gastro-esophageal reflux disease with esophagitis, without bleeding: Secondary | ICD-10-CM

## 2020-11-20 NOTE — Telephone Encounter (Signed)
Requested medications are due for refill today.  Unknown  Requested medications are on the active medications list.  no  Last refill. 12/18/2019  Future visit scheduled.   yes  Notes to clinic.  Medication was discontinued 12/72021. Pt reported no longer taking.

## 2020-12-10 ENCOUNTER — Ambulatory Visit (INDEPENDENT_AMBULATORY_CARE_PROVIDER_SITE_OTHER): Payer: 59

## 2020-12-10 ENCOUNTER — Other Ambulatory Visit: Payer: Self-pay

## 2020-12-10 DIAGNOSIS — Z23 Encounter for immunization: Secondary | ICD-10-CM

## 2020-12-16 ENCOUNTER — Other Ambulatory Visit: Payer: Self-pay | Admitting: Ophthalmology

## 2020-12-16 DIAGNOSIS — G453 Amaurosis fugax: Secondary | ICD-10-CM

## 2020-12-24 ENCOUNTER — Ambulatory Visit
Admission: RE | Admit: 2020-12-24 | Discharge: 2020-12-24 | Disposition: A | Discharge status: Home / Self Care | Payer: 59 | Source: Ambulatory Visit | Attending: Ophthalmology | Admitting: Ophthalmology

## 2020-12-24 ENCOUNTER — Other Ambulatory Visit: Payer: Self-pay

## 2020-12-24 DIAGNOSIS — G453 Amaurosis fugax: Secondary | ICD-10-CM | POA: Insufficient documentation

## 2021-02-03 ENCOUNTER — Encounter: Payer: Self-pay | Admitting: Family Medicine

## 2021-02-04 NOTE — Progress Notes (Deleted)
Annual Wellness Visit     Patient: Danielle Ortega, Female    DOB: 1956/01/08, 65 y.o.   MRN: [id number] Visit Date: 02/05/2021  Today's Provider: Lavon Paganini, MD   No chief complaint on file.  Subjective    Danielle Ortega is a 65 y.o. female who presents today for her Annual Wellness Visit. She reports consuming a {diet types:17450} diet. {Exercise:19826} She generally feels {well/fairly well/poorly:18703}. She reports sleeping {well/fairly well/poorly:18703}. She {does/does not:200015} have additional problems to discuss today.   HPI    Medications: Outpatient Medications Prior to Visit  Medication Sig   Ascorbic Acid (VITAMIN C) 1000 MG tablet Take by mouth.   aspirin 81 MG tablet Take 1 tablet (81 mg total) by mouth daily.   Biotin 5000 MCG CAPS Take by mouth.   cetirizine (ZYRTEC) 10 MG tablet TAKE 1 TABLET BY MOUTH  DAILY   clonazePAM (KLONOPIN) 0.5 MG tablet TAKE 1 TABLET BY MOUTH AT  BEDTIME AS NEEDED FOR  ANXIETY   Cranberry 425 MG CAPS Take by mouth.   Krill Oil 1000 MG CAPS Take by mouth.   Magnesium 100 MG TABS Take by mouth.   Multiple Vitamin (MULTIVITAMIN) tablet Take 1 tablet by mouth daily.   omeprazole (PRILOSEC) 20 MG capsule TAKE 1 CAPSULE BY MOUTH  DAILY   triamterene-hydrochlorothiazide (DYAZIDE) 37.5-25 MG capsule TAKE 1 CAPSULE BY MOUTH  DAILY   VITAMIN E PO Take 16,000 Units daily by mouth.   No facility-administered medications prior to visit.    Allergies  Allergen Reactions   Hydrocodone Other (See Comments)    Dizziness and ineffective    Patient Care Team: Virginia Crews, MD as PCP - General (Family Medicine)  Review of Systems  {Labs   Heme   Chem   Endocrine   Serology   Results Review (optional):23779}    Objective    Vitals: There were no vitals taken for this visit. {Show previous vital signs (optional):23777}  Physical Exam ***  Most recent functional status assessment: No flowsheet data found. Most  recent fall risk assessment: Fall Risk  01/29/2020  Falls in the past year? 0  Number falls in past yr: 0  Injury with Fall? 0  Risk for fall due to : No Fall Risks  Follow up Falls evaluation completed    Most recent depression screenings: PHQ 2/9 Scores 01/29/2020 01/26/2019  PHQ - 2 Score 0 1  PHQ- 9 Score 1 2   Most recent cognitive screening: No flowsheet data found. Most recent Audit-C alcohol use screening Alcohol Use Disorder Test (AUDIT) 01/29/2020  1. How often do you have a drink containing alcohol? 2  2. How many drinks containing alcohol do you have on a typical day when you are drinking? 0  3. How often do you have six or more drinks on one occasion? 0  AUDIT-C Score 2  4. How often during the last year have you found that you were not able to stop drinking once you had started? -  5. How often during the last year have you failed to do what was normally expected from you because of drinking? -  6. How often during the last year have you needed a first drink in the morning to get yourself going after a heavy drinking session? -  7. How often during the last year have you had a feeling of guilt of remorse after drinking? -  8. How often during the last year have  you been unable to remember what happened the night before because you had been drinking? -  9. Have you or someone else been injured as a result of your drinking? -  10. Has a relative or friend or a doctor or another health worker been concerned about your drinking or suggested you cut down? -  Alcohol Use Disorder Identification Test Final Score (AUDIT) -  Alcohol Brief Interventions/Follow-up AUDIT Score <7 follow-up not indicated   A score of 3 or more in women, and 4 or more in men indicates increased risk for alcohol abuse, EXCEPT if all of the points are from question 1   No results found for any visits on 02/05/21.  Assessment & Plan     Annual wellness visit done today including the all of the  following: Reviewed patient's Family Medical History Reviewed and updated list of patient's medical providers Assessment of cognitive impairment was done Assessed patient's functional ability Established a written schedule for health screening [facility] Completed and Reviewed  Exercise Activities and Dietary recommendations  Goals   None     Immunization History  Administered Date(s) Administered   Fluad Quad(high Dose 65+) 10/18/2018, 12/10/2020   Influenza,inj,Quad PF,6+ Mos 12/27/2016, 12/28/2017, 01/23/2020   PFIZER Comirnaty(Gray Top)Covid-19 Tri-Sucrose Vaccine 07/16/2020   PFIZER(Purple Top)SARS-COV-2 Vaccination 04/19/2019, 05/10/2019, 01/23/2020   Pneumococcal Conjugate-13 01/26/2019   Pneumococcal Polysaccharide-23 03/03/2020   Tdap 09/10/2011   Zoster Recombinat (Shingrix) 01/26/2019, 03/30/2019    Health Maintenance  Topic Date Due   COVID-19 Vaccine (5 - Booster for Pfizer series) 09/10/2020   MAMMOGRAM  01/10/2021   TETANUS/TDAP  09/09/2021   COLONOSCOPY (Pts 45-70yrs Insurance coverage will need to be confirmed)  01/13/2023   Pneumonia Vaccine 48+ Years old  Completed   INFLUENZA VACCINE  Completed   DEXA SCAN  Completed   Hepatitis C Screening  Completed   Zoster Vaccines- Shingrix  Completed   HPV VACCINES  Aged Out     Discussed health benefits of physical activity, and encouraged her to engage in regular exercise appropriate for her age and condition.    ***  No follow-ups on file.     {provider attestation***:1}   Lavon Paganini, MD  [facility] [phone] (phone) [phone] (fax)  [place]

## 2021-02-05 ENCOUNTER — Encounter: Payer: Self-pay | Admitting: Family Medicine

## 2021-02-25 NOTE — Progress Notes (Signed)
Complete physical exam   Patient: Danielle Ortega   DOB: 18-Dec-1955   66 y.o. Female  MRN: [id number] Visit Date: 02/26/2021  Today's healthcare provider: Lavon Paganini, MD   Chief Complaint  Patient presents with   Annual Exam   Subjective    Danielle Ortega is a 66 y.o. female who presents today for a complete physical exam.  She reports consuming a general diet. The patient does not participate in regular exercise at present. She generally feels well. She reports sleeping well. She does not have additional problems to discuss today.  HPI    Past Medical History:  Diagnosis Date   Allergy    Complication of anesthesia    extreme nausea with shoulder surgery   Hypertension    PONV (postoperative nausea and vomiting)    Past Surgical History:  Procedure Laterality Date   COLONOSCOPY WITH PROPOFOL N/A 01/12/2018   Procedure: COLONOSCOPY WITH PROPOFOL;  Surgeon: Virgel Manifold, MD;  Location: [facility] ENDOSCOPY;  Service: Endoscopy;  Laterality: N/A;   SHOULDER ARTHROSCOPY Left    TONSILLECTOMY     Social History   Socioeconomic History   Marital status: Married    Spouse name: [name]   Number of children: 0   Years of education: 19   Highest education level: Not on file  Occupational History   Not on file  Tobacco Use   Smoking status: Never   Smokeless tobacco: Never  Vaping Use   Vaping Use: Never used  Substance and Sexual Activity   Alcohol use: Yes    Alcohol/week: 2.0 standard drinks    Types: 2 Shots of liquor per week   Drug use: No   Sexual activity: Not Currently  Other Topics Concern   Not on file  Social History Narrative   Not on file   Social Determinants of Health   Financial Resource Strain: Not on file  Food Insecurity: Not on file  Transportation Needs: Not on file  Physical Activity: Not on file  Stress: Not on file  Social Connections: Not on file  Intimate Partner Violence: Not on file   Family Status  Relation  Name Status   Mother  Deceased   Father  Deceased   Brother  Alive   Neg Hx  (Not Specified)   Family History  Problem Relation Age of Onset   Lung cancer Mother    Hypertension Father    Heart attack Father    Breast cancer Neg Hx    Allergies  Allergen Reactions   Hydrocodone Other (See Comments)    Dizziness and ineffective    Patient Care Team: Virginia Crews, MD as PCP - General (Family Medicine)   Medications: Outpatient Medications Prior to Visit  Medication Sig   Ascorbic Acid (VITAMIN C) 1000 MG tablet Take by mouth.   aspirin 81 MG tablet Take 1 tablet (81 mg total) by mouth daily.   Biotin 5000 MCG CAPS Take by mouth.   cetirizine (ZYRTEC) 10 MG tablet TAKE 1 TABLET BY MOUTH  DAILY   Cranberry 425 MG CAPS Take by mouth.   Krill Oil 1000 MG CAPS Take by mouth.   Magnesium 100 MG TABS Take by mouth.   Multiple Vitamin (MULTIVITAMIN) tablet Take 1 tablet by mouth daily.   omeprazole (PRILOSEC) 20 MG capsule TAKE 1 CAPSULE BY MOUTH  DAILY   triamterene-hydrochlorothiazide (DYAZIDE) 37.5-25 MG capsule TAKE 1 CAPSULE BY MOUTH  DAILY   VITAMIN E PO  Take 16,000 Units daily by mouth.   [DISCONTINUED] clonazePAM (KLONOPIN) 0.5 MG tablet TAKE 1 TABLET BY MOUTH AT  BEDTIME AS NEEDED FOR  ANXIETY   No facility-administered medications prior to visit.    Review of Systems  Constitutional: Negative.   HENT: Negative.    Eyes: Negative.   Respiratory: Negative.    Cardiovascular: Negative.   Gastrointestinal: Negative.   Endocrine: Negative.   Genitourinary: Negative.   Musculoskeletal: Negative.   Skin: Negative.   Allergic/Immunologic: Negative.   Neurological: Negative.   Hematological: Negative.   Psychiatric/Behavioral: Negative.       Objective    BP 123/81 (BP Location: Right Arm, Patient Position: Sitting, Cuff Size: Large)    Pulse 72    Temp 98.6 F (37 C) (Oral)    Resp 16    Ht 4' 11.5" (1.511 m)    Wt 142 lb 3.2 oz (64.5 kg)    BMI 28.24 kg/m     Physical Exam Vitals reviewed.  Constitutional:      General: She is not in acute distress.    Appearance: Normal appearance. She is well-developed. She is not diaphoretic.  HENT:     Head: Normocephalic and atraumatic.     Right Ear: Tympanic membrane, ear canal and external ear normal.     Left Ear: Tympanic membrane, ear canal and external ear normal.     Nose: Nose normal.     Mouth/Throat:     Mouth: Mucous membranes are moist.     Pharynx: Oropharynx is clear. No oropharyngeal exudate.  Eyes:     General: No scleral icterus.    Conjunctiva/sclera: Conjunctivae normal.     Pupils: Pupils are equal, round, and reactive to light.  Neck:     Thyroid: No thyromegaly.  Cardiovascular:     Rate and Rhythm: Normal rate and regular rhythm.     Pulses: Normal pulses.     Heart sounds: Normal heart sounds. No murmur heard. Pulmonary:     Effort: Pulmonary effort is normal. No respiratory distress.     Breath sounds: Normal breath sounds. No wheezing or rales.  Chest:     Comments: Breasts: breasts appear normal, no suspicious masses, no skin or nipple changes or axillary nodes  Abdominal:     General: There is no distension.     Palpations: Abdomen is soft.     Tenderness: There is no abdominal tenderness.  Musculoskeletal:        General: No deformity.     Cervical back: Neck supple.     Right lower leg: No edema.     Left lower leg: No edema.  Lymphadenopathy:     Cervical: No cervical adenopathy.  Skin:    General: Skin is warm and dry.     Findings: No rash.  Neurological:     Mental Status: She is alert and oriented to person, place, and time. Mental status is at baseline.     Sensory: No sensory deficit.     Motor: No weakness.     Gait: Gait normal.  Psychiatric:        Mood and Affect: Mood normal.        Behavior: Behavior normal.        Thought Content: Thought content normal.      Last depression screening scores PHQ 2/9 Scores 02/26/2021 01/29/2020  01/26/2019  PHQ - 2 Score 0 0 1  PHQ- 9 Score - 1 2   Last fall risk screening  Fall Risk  02/26/2021  Falls in the past year? 0  Number falls in past yr: 0  Injury with Fall? 0  Risk for fall due to : -  Follow up -   Last Audit-C alcohol use screening Alcohol Use Disorder Test (AUDIT) 01/29/2020  1. How often do you have a drink containing alcohol? 2  2. How many drinks containing alcohol do you have on a typical day when you are drinking? 0  3. How often do you have six or more drinks on one occasion? 0  AUDIT-C Score 2  4. How often during the last year have you found that you were not able to stop drinking once you had started? -  5. How often during the last year have you failed to do what was normally expected from you because of drinking? -  6. How often during the last year have you needed a first drink in the morning to get yourself going after a heavy drinking session? -  7. How often during the last year have you had a feeling of guilt of remorse after drinking? -  8. How often during the last year have you been unable to remember what happened the night before because you had been drinking? -  9. Have you or someone else been injured as a result of your drinking? -  10. Has a relative or friend or a doctor or another health worker been concerned about your drinking or suggested you cut down? -  Alcohol Use Disorder Identification Test Final Score (AUDIT) -  Alcohol Brief Interventions/Follow-up AUDIT Score <7 follow-up not indicated   A score of 3 or more in women, and 4 or more in men indicates increased risk for alcohol abuse, EXCEPT if all of the points are from question 1   No results found for any visits on 02/26/21.  Assessment & Plan    Routine Health Maintenance and Physical Exam  Exercise Activities and Dietary recommendations  Goals   None     Immunization History  Administered Date(s) Administered   Fluad Quad(high Dose 65+) 10/18/2018, 12/10/2020    Influenza,inj,Quad PF,6+ Mos 12/27/2016, 12/28/2017, 01/23/2020   PFIZER Comirnaty(Gray Top)Covid-19 Tri-Sucrose Vaccine 07/16/2020   PFIZER(Purple Top)SARS-COV-2 Vaccination 04/19/2019, 05/10/2019, 01/23/2020   Pneumococcal Conjugate-13 01/26/2019   Pneumococcal Polysaccharide-23 03/03/2020   Tdap 09/10/2011   Zoster Recombinat (Shingrix) 01/26/2019, 03/30/2019    Health Maintenance  Topic Date Due   COVID-19 Vaccine (5 - Booster for Pfizer series) 09/10/2020   MAMMOGRAM  01/10/2021   TETANUS/TDAP  09/09/2021   COLONOSCOPY (Pts 45-62yrs Insurance coverage will need to be confirmed)  01/13/2023   Pneumonia Vaccine 77+ Years old  Completed   INFLUENZA VACCINE  Completed   DEXA SCAN  Completed   Hepatitis C Screening  Completed   Zoster Vaccines- Shingrix  Completed   HPV VACCINES  Aged Out    Discussed health benefits of physical activity, and encouraged her to engage in regular exercise appropriate for her age and condition.  Problem List Items Addressed This Visit       Cardiovascular and Mediastinum   Essential hypertension, benign    Well controlled Continue current medications Recheck metabolic panel F/u in 6 months       Relevant Orders   Basic Metabolic Panel (BMET)     Other   Insomnia    Longstanding  Well controlled on klonopin low dose Didn't tolerate trazodone or melatonin Have discussed risks of long term benzos in the  elderly Will not plan to dose escalate      Relevant Medications   clonazePAM (KLONOPIN) 0.5 MG tablet   Pure hypercholesterolemia    Reviewed last lipid panel Not currently on a statin Recheck FLP and CMP Discussed diet and exercise       Other Visit Diagnoses     Encounter for annual physical exam    -  Primary   Relevant Orders   Basic Metabolic Panel (BMET)   Screening mammogram for breast cancer       Relevant Orders   MM 3D SCREEN BREAST BILATERAL        Return in about 6 months (around 08/26/2021) for chronic  disease f/u.     I, Lavon Paganini, MD, have reviewed all documentation for this visit. The documentation on 02/26/21 for the exam, diagnosis, procedures, and orders are all accurate and complete.   Ladarryl Wrage, [name], MD, MPH [facility]

## 2021-02-26 ENCOUNTER — Other Ambulatory Visit: Payer: Self-pay

## 2021-02-26 ENCOUNTER — Ambulatory Visit (INDEPENDENT_AMBULATORY_CARE_PROVIDER_SITE_OTHER): Payer: 59 | Admitting: Family Medicine

## 2021-02-26 ENCOUNTER — Encounter: Payer: Self-pay | Admitting: Family Medicine

## 2021-02-26 VITALS — BP 123/81 | HR 72 | Temp 98.6°F | Resp 16 | Ht 59.5 in | Wt 142.2 lb

## 2021-02-26 DIAGNOSIS — I10 Essential (primary) hypertension: Secondary | ICD-10-CM | POA: Diagnosis not present

## 2021-02-26 DIAGNOSIS — Z0000 Encounter for general adult medical examination without abnormal findings: Secondary | ICD-10-CM

## 2021-02-26 DIAGNOSIS — F5101 Primary insomnia: Secondary | ICD-10-CM | POA: Diagnosis not present

## 2021-02-26 DIAGNOSIS — E7800 Pure hypercholesterolemia, unspecified: Secondary | ICD-10-CM

## 2021-02-26 DIAGNOSIS — Z1231 Encounter for screening mammogram for malignant neoplasm of breast: Secondary | ICD-10-CM

## 2021-02-26 DIAGNOSIS — G4700 Insomnia, unspecified: Secondary | ICD-10-CM

## 2021-02-26 MED ORDER — CLONAZEPAM 0.5 MG PO TABS: ORAL_TABLET | ORAL | 1 refills | Status: DC

## 2021-02-26 NOTE — Assessment & Plan Note (Signed)
Well controlled Continue current medications Recheck metabolic panel F/u in 6 months  

## 2021-02-26 NOTE — Assessment & Plan Note (Signed)
Longstanding  Well controlled on klonopin low dose Didn't tolerate trazodone or melatonin Have discussed risks of long term benzos in the elderly Will not plan to dose escalate

## 2021-02-26 NOTE — Assessment & Plan Note (Signed)
Reviewed last lipid panel Not currently on a statin Recheck FLP and CMP Discussed diet and exercise  

## 2021-02-27 ENCOUNTER — Telehealth: Payer: Self-pay

## 2021-02-27 DIAGNOSIS — E870 Hyperosmolality and hypernatremia: Secondary | ICD-10-CM

## 2021-02-27 LAB — BASIC METABOLIC PANEL
BUN/Creatinine Ratio: 21 (ref 12–28)
BUN: 18 mg/dL (ref 8–27)
CO2: 24 mmol/L (ref 20–29)
Calcium: 10.5 mg/dL — ABNORMAL HIGH (ref 8.7–10.3)
Chloride: 103 mmol/L (ref 96–106)
Creatinine, Ser: 0.84 mg/dL (ref 0.57–1.00)
Glucose: 95 mg/dL (ref 70–99)
Potassium: 5.5 mmol/L — ABNORMAL HIGH (ref 3.5–5.2)
Sodium: 145 mmol/L — ABNORMAL HIGH (ref 134–144)
eGFR: 76 mL/min/{1.73_m2} (ref >59)

## 2021-02-27 NOTE — Telephone Encounter (Signed)
Patient aware.Lab ordered

## 2021-02-27 NOTE — Telephone Encounter (Signed)
-----   Message from Virginia Crews, MD sent at 02/27/2021  8:19 AM EST ----- Normal/stable labs, except some signs of possible dehydration with high sodium, high potassium, and calcium.  Recommend hydrating well and repeat BMP in 1 month.

## 2021-03-18 ENCOUNTER — Other Ambulatory Visit: Payer: Self-pay | Admitting: Family Medicine

## 2021-03-18 DIAGNOSIS — G4700 Insomnia, unspecified: Secondary | ICD-10-CM

## 2021-03-18 NOTE — Telephone Encounter (Signed)
Requested medication (s) are on the active medication list: This is an old rx, has newer one from 02/26/21  Future visit scheduled: 08/28/21  Notes to clinic:  This medication can not be delegated, has newer rx, please assess.    Requested Prescriptions  Pending Prescriptions Disp Refills   clonazePAM (KLONOPIN) 0.5 MG tablet [Pharmacy Med Name: CLONAZEPAM 0.5 MG TABLET] 30 tablet     Sig: TAKE 1 TABLET BY MOUTH AT  BEDTIME AS NEEDED FOR  ANXIETY     Not Delegated - Psychiatry:  Anxiolytics/Hypnotics Failed - 03/18/2021  1:28 AM      Failed - This refill cannot be delegated      Failed - Urine Drug Screen completed in last 360 days      Passed - Valid encounter within last 6 months    Recent Outpatient Visits           2 weeks ago Encounter for annual physical exam   [facility], [name], MD   6 months ago Essential hypertension, benign   [facility] [place], [name], MD   1 year ago Need for vaccination against Streptococcus pneumoniae   [facility] [place], [name], MD   1 year ago Encounter for annual physical exam   [facility] [place], [name], MD   1 year ago Essential hypertension, benign   [place], [name], MD       Future Appointments             In 5 months [name], [name], MD [facility], [place]   In 6 months [name], [name], MD [facility], [place]

## 2021-06-23 ENCOUNTER — Other Ambulatory Visit: Payer: Self-pay | Admitting: Family Medicine

## 2021-06-23 DIAGNOSIS — G4700 Insomnia, unspecified: Secondary | ICD-10-CM

## 2021-06-24 NOTE — Telephone Encounter (Signed)
Requested medication (s) are due for refill today:   Provider to review ? ?Requested medication (s) are on the active medication list:   Yes ? ?Future visit scheduled:   Yes ? ? ?Last ordered: 02/26/2021 #90, 1 refill ? ?Non delegated refill  ? ?Requested Prescriptions  ?Pending Prescriptions Disp Refills  ? clonazePAM (KLONOPIN) 0.5 MG tablet [Pharmacy Med Name: clonazePAM 0.5 MG Oral Tablet] 90 tablet   ?  Sig: TAKE 1 TABLET BY MOUTH AT  BEDTIME AS NEEDED FOR  ANXIETY  ?  ? Not Delegated - Psychiatry: Anxiolytics/Hypnotics 2 Failed - 06/23/2021  4:47 PM  ?  ?  Failed - This refill cannot be delegated  ?  ?  Failed - Urine Drug Screen completed in last 360 days  ?  ?  Passed - Patient is not pregnant  ?  ?  Passed - Valid encounter within last 6 months  ?  Recent Outpatient Visits   ? ?      ? 3 months ago Encounter for annual physical exam  ? [facility], [name], MD  ? 9 months ago Essential hypertension, benign  ? [facility] [place], [name], MD  ? 1 year ago Need for vaccination against Streptococcus pneumoniae  ? [facility] [name], [name], MD  ? 1 year ago Encounter for annual physical exam  ? [facility] [place], [name], MD  ? 1 year ago Essential hypertension, benign  ? [facility] [name], [name], MD  ? ?  ?  ?Future Appointments   ? ?        ? In 2 months [name], [name], MD [facility]  ? In 2 months [name], [name], MD [facility]  ? ?  ? ? ?  ?  ?  ? ?

## 2021-06-24 NOTE — Telephone Encounter (Signed)
Please advise refill? ? ?LOV: 02/26/2021 ?NOV: 08/28/2021 ?Last refill: 02/26/2021 #90 x1 ? ?

## 2021-08-28 ENCOUNTER — Ambulatory Visit: Payer: 59 | Admitting: Family Medicine

## 2021-09-01 ENCOUNTER — Other Ambulatory Visit: Payer: Self-pay | Admitting: Family Medicine

## 2021-09-01 DIAGNOSIS — I10 Essential (primary) hypertension: Secondary | ICD-10-CM

## 2021-09-08 ENCOUNTER — Ambulatory Visit: Payer: Self-pay

## 2021-09-08 NOTE — Telephone Encounter (Signed)
  Chief Complaint: right lower quadrant abdominal pain and right lower back pain.  Symptoms: severe pain "ovary  area" Frequency: ovarian pain on Friday back pain Sunday Pertinent Negatives: Patient denies nausea, vomiting, diarrhea, fever Disposition: '[x]'$ ED /'[]'$ Urgent Care (no appt availability in office) / '[]'$ Appointment(In office/virtual)/ '[]'$  [place] [facility]/ '[]'$ Home Care/ '[]'$ Refused Recommended Disposition /'[]'$ [place] [facility]/ '[]'$  Follow-up with PCP Additional Notes: Pt stated she wanted to go to UC- advised pt that UC may send to ED and to go to ED first     Reason for Disposition  [1] SEVERE pain (e.g., excruciating) AND [2] present > 1 hour  Answer Assessment - Initial Assessment Questions 1. LOCATION: "Where does it hurt?"      Lower right abd and right lower back ovary pain first(Friday) then Sunday back 2. RADIATION: "Does the pain shoot anywhere else?" (e.g., chest, back)     back 3. ONSET: "When did the pain begin?" (e.g., minutes, hours or days ago)      2 days 4. SUDDEN: "Gradual or sudden onset?"     Gradual  back started Sunday 5. PATTERN "Does the pain come and go, or is it constant?"    - If it comes and goes: "How long does it last?" "Do you have pain now?"     (Note: Comes and goes means the pain is intermittent. It goes away completely between bouts.)    - If constant: "Is it getting better, staying the same, or getting worse?"      (Note: Constant means the pain never goes away completely; most serious pain is constant and gets worse.)      constant 6. SEVERITY: "How bad is the pain?"  (e.g., Scale 1-10; mild, moderate, or severe)    - MILD (1-3): Doesn't interfere with normal activities, abdomen soft and not tender to touch.     - MODERATE (4-7): Interferes with normal activities or awakens from sleep, abdomen tender to touch.     - SEVERE (8-10): Excruciating pain, doubled over, unable to do any normal activities.       severe 7. RECURRENT SYMPTOM:  "Have you ever had this type of stomach pain before?" If Yes, ask: "When was the last time?" and "What happened that time?"      Ovarian area has been hurting on and for 10 years 8. CAUSE: "What do you think is causing the stomach pain?"     Ovarian pain 9. RELIEVING/AGGRAVATING FACTORS: "What makes it better or worse?" (e.g., antacids, bending or twisting motion, bowel movement)     nothing 10. OTHER SYMPTOMS: "Do you have any other symptoms?" (e.g., back pain, diarrhea, fever, urination pain, vomiting)       Back pain  Protocols used: Abdominal Pain - Female-A-AH

## 2021-09-10 ENCOUNTER — Other Ambulatory Visit: Payer: Self-pay | Admitting: Family Medicine

## 2021-09-10 ENCOUNTER — Ambulatory Visit: Payer: Self-pay | Admitting: *Deleted

## 2021-09-10 DIAGNOSIS — N9489 Other specified conditions associated with female genital organs and menstrual cycle: Secondary | ICD-10-CM

## 2021-09-10 NOTE — Telephone Encounter (Signed)
  Chief Complaint: Danielle Ortega , [place] requesting assistance with surgical intervention for recent dx. Hydrocele of canal of nuck. Per ED visit 09/08/21.  Symptoms: left groin pain worsening. Requesting to see if PCP can encourage for sooner surgical time frame for patient.  Frequency: 09/08/21 Pertinent Negatives: Patient denies groin swelling no fever. No left leg swelling or redness.  Disposition: '[x]'$ ED /'[]'$ Urgent Care (no appt availability in office) / '[]'$ Appointment(In office/virtual)/ '[]'$  [place] [facility]/ '[]'$ Home Care/ '[]'$ Refused Recommended Disposition /'[]'$ [place] [facility]/ '[]'$  Follow-up with PCP Additional Notes:   Requesting a call back . Please advise if PCP can assist with notifying surgical team to do surgery .    Reason for Disposition  [1] MODERATE to SEVERE pain (e.g., interferes with normal activities, awakens from sleep, excruciating) AND [2] no known injury or muscle strain  Answer Assessment - Initial Assessment Questions 1. MECHANISM: "How did the injury happen?" (e.g., twisting injury, direct blow)      Na dx hydrocele of canal of nuck 2. ONSET: "When did the injury happen?" (Minutes or hours ago)      na 3. LOCATION: "Where is the injury located?"      Pain located left groin 4. APPEARANCE of INJURY: "What does the injury look like?"  (e.g., looks normal; bruise, swelling)     Normal  5. PAIN: "Is there pain?" If Yes, ask: "How bad is the pain?"   "What does it keep you from doing?" (e.g., Scale 1-10; or mild, moderate, severe)   -  NONE: (0): No pain.   -  MILD (1-3): Doesn't interfere with normal activities.    -  MODERATE (4-7): Interferes with normal activities (e.g., work or school) or awakens from sleep, limping.    -  SEVERE (8-10): Excruciating pain, unable to do any normal activities, unable to walk.     Pain worse.  6. SIZE: For cuts, bruises, or swelling, ask: "How large is it?" (e.g., inches or centimeters;  entire joint)      na 7. TETANUS: For any  breaks in the skin, ask: "When was the last tetanus booster?"     na 8. OTHER SYMPTOMS: "Do you have any other symptoms?"      Na  9. PREGNANCY: "Is there any chance you are pregnant?" "When was your last menstrual period?"     na  Protocols used: Groin Injury and Strain-A-AH

## 2021-09-11 ENCOUNTER — Ambulatory Visit: Payer: Self-pay

## 2021-09-11 NOTE — Telephone Encounter (Signed)
  Chief Complaint: Pt  pain fever Symptoms: Pain fever Frequency: now Pertinent Negatives: Patient denies  Disposition: '[]'$ ED /'[]'$ Urgent Care (no appt availability in office) / '[]'$ Appointment(In office/virtual)/ '[]'$  [place] [facility]/ '[]'$ Home Care/ '[]'$ Refused Recommended Disposition /'[]'$ [place] [facility]/ '[x]'$  Follow-up with PCP Additional Notes: Spoke with Izora Gala, she and pt are in the waiting room at [facility] emergency room. They have been there for several hours. Lovey Newcomer now has a temperature of 102 and wants to leave as they have not been seen yet. Pt and wife were hoping that a call from the clinic to the ED would help to shorten their wait. If possible please call ED and pt.    Answer Assessment - Initial Assessment Questions 1. REASON FOR CALL or QUESTION: "What is your reason for calling today?" or "How can I best help you?" or "What question do you have that I can help answer?"     Call from [place], pt is feeling worse and has a fever of 102. They are at [facility] waiting in the waiting room.  Protocols used: Information Only Call - No Triage-A-AH

## 2021-09-12 ENCOUNTER — Encounter: Payer: Self-pay | Admitting: Family Medicine

## 2021-09-14 ENCOUNTER — Ambulatory Visit: Payer: 59 | Admitting: Family Medicine

## 2021-09-14 NOTE — Progress Notes (Deleted)
    SUBJECTIVE:   CHIEF COMPLAINT / HPI:   Hypertension: - Medications: maxzide - Compliance: *** - Checking BP at home: *** - Denies any SOB, CP, vision changes, LE edema, medication SEs, or symptoms of hypotension - Diet: *** - Exercise: ***  HLD - medications: none - compliance: *** - medication SEs: *** The 10-year ASCVD risk score (Arnett DK, et al., 2019) is: 10.5%   Values used to calculate the score:     Age: 66 years     Sex: Female     Is Non-Hispanic African American: No     Diabetic: No     Tobacco smoker: No     Systolic Blood Pressure: 206 mmHg     Is BP treated: Yes     HDL Cholesterol: 68 mg/dL     Total Cholesterol: 185 mg/dL   GERD - Meds: prilosec - Symptoms:  {gerd sx:13195}.  - Denies {gerd sx:13195} {gerd dysphagia:12484} {weight loss:12477} {gross bleeding:12475}.  - Previous treatment: {acid meds:12473}.  Allergic rhinitis - on zyrtec.  Insomnia, Anxiety - on klonopin prn***. Taking ***. Previously tried trazodone, melatonin but did not tolerate. Per last PCP note, no plans to dose escalate given age and risk of BZD use in elderly.  OBJECTIVE:   There were no vitals taken for this visit.  ***  ASSESSMENT/PLAN:   No problem-specific Assessment & Plan notes found for this encounter.     Myles Gip, DO

## 2021-09-17 ENCOUNTER — Ambulatory Visit: Payer: 59 | Admitting: Family Medicine

## 2021-09-28 ENCOUNTER — Encounter: Payer: Self-pay | Admitting: Obstetrics & Gynecology

## 2021-09-28 ENCOUNTER — Ambulatory Visit (INDEPENDENT_AMBULATORY_CARE_PROVIDER_SITE_OTHER): Payer: 59 | Admitting: Obstetrics & Gynecology

## 2021-09-28 VITALS — BP 132/90 | Ht 60.0 in | Wt 147.0 lb

## 2021-09-28 DIAGNOSIS — K4090 Unilateral inguinal hernia, without obstruction or gangrene, not specified as recurrent: Secondary | ICD-10-CM | POA: Diagnosis not present

## 2021-09-28 DIAGNOSIS — R9389 Abnormal findings on diagnostic imaging of other specified body structures: Secondary | ICD-10-CM

## 2021-09-28 DIAGNOSIS — N859 Noninflammatory disorder of uterus, unspecified: Secondary | ICD-10-CM

## 2021-09-28 NOTE — Progress Notes (Signed)
   Subjective:    Patient ID: Danielle Ortega, female    DOB: [birth year], 66 y.o.   MRN: [id number]  HPI 66 yo single G0 here as a new patient referred by her primary care for evaluation of a right inguinal hernia (hydrocele of canal of Nuck). This was found via CT and ultrasound at [facility] 09/08/2021. She has a very long h/o right pelvic pain and this hernia was seen on imaging several years ago.  During an ultrasound 02/14/2020 her endometrium was noted to be 81m (abnormal for postmenopausal state). She denies any PMB.   Review of Systems She is gay and doesn't have penetrative sex.     Objective:   Physical Exam Well nourished, well hydrated White female, no apparent distress She is ambulating and conversing normally. Abd- benign Bimanual exam- reveals a small mobile, non-tender adnexa I am unable to feel her hernia.       Assessment & Plan:   Thickened endometrium seen 2021- [facility] the [facility]rium is more than 4 mm. She opts for the ultrasound. With regard to the right inguinal hernia, I will refer her to a general surgeon.

## 2021-09-29 ENCOUNTER — Other Ambulatory Visit: Payer: Self-pay | Admitting: Family Medicine

## 2021-10-07 ENCOUNTER — Encounter: Payer: Self-pay | Admitting: Surgery

## 2021-10-07 ENCOUNTER — Ambulatory Visit (INDEPENDENT_AMBULATORY_CARE_PROVIDER_SITE_OTHER): Payer: 59 | Admitting: Surgery

## 2021-10-07 VITALS — BP 129/85 | HR 90 | Temp 98.7°F | Ht 60.0 in | Wt 144.2 lb

## 2021-10-07 DIAGNOSIS — Q524 Other congenital malformations of vagina: Secondary | ICD-10-CM | POA: Diagnosis not present

## 2021-10-07 NOTE — H&P (View-Only) (Signed)
10/07/2021  Reason for Visit:  Cyst of right canal of Nuck  Requesting Provider:  Clovia Cuff, MD  History of Present Illness: Danielle Ortega is a 66 y.o. female presenting for evaluation of a cyst of the canal of knock on the right groin.  The patient reports that she has had an area in the right groin for many years that was thought to be a potential hernia.  She noticed from time to time that she would have a aggravation of swelling there.  However more recently she has been having more issues in that area of the right groin.  She presented to [facility] emergency department on 09/08/2021 with a 3-day history of progressively worsening pain in her right lower quadrant.  She had initial concern that this could be related to appendicitis versus the hernia.  She had a CT scan of the abdomen pelvis which showed a cystic lesion in the right inguinal region.  This was followed by ultrasound of the pelvis which showed a 2.1 cm cystic lesion in the right inguinal region corresponding to a hydrocele of the canal of Nuck.  She unfortunately then developed COVID but has been improving from that standpoint.  The patient reports that more recently the pain has been improving as well but she reports intermittent episodes of discomfort or shooting pain in the right groin.  Denies any troubles with the left groin or other areas of her body.  On personal review of her imaging studies, she did have a CT scan in our system in April 2008 which did show a very small area of small amount of fluid and a very small right inguinal hernia.  I have also seen the images from the CT scan at [facility] done last month and can see the cystic structure in the right groin.  Past Medical History: Past Medical History:  Diagnosis Date   Allergy    Complication of anesthesia    extreme nausea with shoulder surgery   Hypertension    PONV (postoperative nausea and vomiting)      Past Surgical History: Past Surgical History:  Procedure  Laterality Date   COLONOSCOPY WITH PROPOFOL N/A 01/12/2018   Procedure: COLONOSCOPY WITH PROPOFOL;  Surgeon: Virgel Manifold, MD;  Location: [facility] ENDOSCOPY;  Service: Endoscopy;  Laterality: N/A;   SHOULDER ARTHROSCOPY Left    TONSILLECTOMY      Home Medications: Prior to Admission medications   Medication Sig Start Date End Date Taking? Authorizing Provider  aspirin 81 MG tablet Take 1 tablet (81 mg total) by mouth daily. 05/05/17  Yes [name], [name], MD  Biotin 5000 MCG CAPS Take by mouth.   Yes [provider]  cetirizine (ZYRTEC) 10 MG tablet TAKE 1 TABLET BY MOUTH  DAILY 09/29/21  Yes Gwyneth Sprout, FNP  clonazePAM (KLONOPIN) 0.5 MG tablet TAKE 1 TABLET BY MOUTH AT  BEDTIME AS NEEDED FOR  ANXIETY 02/26/21  Yes [name], [name], MD  Cranberry 425 MG CAPS Take by mouth.   Yes [provider]  Javier Docker Oil 1000 MG CAPS Take by mouth.   Yes [provider]  Magnesium 100 MG TABS Take by mouth.   Yes [provider]  Multiple Vitamin (MULTIVITAMIN) tablet Take 1 tablet by mouth daily.   Yes [provider]  triamterene-hydrochlorothiazide (DYAZIDE) 37.5-25 MG capsule TAKE 1 CAPSULE BY MOUTH  DAILY 09/02/21  Yes Ostwalt, Janna, PA-C  VITAMIN E PO Take 16,000 Units daily by mouth.   Yes [provider]    Allergies: Allergies  Allergen Reactions   Hydrocodone Other (See Comments)    Dizziness and ineffective    Social History:  reports that she has never smoked. She has never used smokeless tobacco. She reports current alcohol use of about 2.0 standard drinks of alcohol per week. She reports that she does not use drugs.   Family History: Family History  Problem Relation Age of Onset   Lung cancer Mother    Hypertension Father    Heart attack Father    Breast cancer Neg Hx     Review of Systems: Review of Systems  Constitutional:  Negative for chills and fever.  HENT:  Negative for hearing loss.   Respiratory:   Negative for cough.   Cardiovascular:  Negative for chest pain.  Gastrointestinal:  Positive for abdominal pain. Negative for nausea and vomiting.  Genitourinary:  Negative for dysuria.  Musculoskeletal:  Negative for myalgias.  Skin:  Negative for rash.  Neurological:  Negative for dizziness.  Psychiatric/Behavioral:  Negative for depression.     Physical Exam BP 129/85   Pulse 90   Temp 98.7 F (37.1 C) (Oral)   Ht 5' (1.524 m)   Wt 144 lb 3.2 oz (65.4 kg)   SpO2 98%   BMI 28.16 kg/m  CONSTITUTIONAL: No acute distress, well-nourished HEENT:  Normocephalic, atraumatic, extraocular motion intact. NECK: Trachea is midline, and there is no jugular venous distension.  RESPIRATORY:  Lungs are clear, and breath sounds are equal bilaterally. Normal respiratory effort without pathologic use of accessory muscles. CARDIOVASCULAR: Heart is regular without murmurs, gallops, or rubs. GI: The abdomen is soft, nondistended, with tenderness to palpation in the medial portion of the groin towards the external ring.  Cannot discern a true hernia, but there is a small palpable mass towards the external ring that is more accentuated with coughing/straining.  No left inguinal hernia. MUSCULOSKELETAL:  Normal muscle strength and tone in all four extremities.  No peripheral edema or cyanosis. SKIN: Skin turgor is normal. There are no pathologic skin lesions.  NEUROLOGIC:  Motor and sensation is grossly normal.  Cranial nerves are grossly intact. PSYCH:  Alert and oriented to person, place and time. Affect is normal.  Laboratory Analysis: Labs from 09/11/21: Sodium 141, potassium 3.6, chloride 106, CO2 27.2, BUN 7, creatinine 0.81.  Total bilirubin 0.7, AST 25, ALT 34, alkaline phosphatase 51.  WBC 5.3, hemoglobin 15.7, hematocrit 44.7, platelets 161.  Imaging: CT abdomen/pelvis on 09/08/2021: IMPRESSION:  1. Hepatic steatosis. There is herniation of the right hepatic dome through the diaphragm.  2.  Trace free fluid in the pelvis, presumably physiologic.  3. Nonspecific cystic lesion in the right inguinal region. Clinical correlation is advised. Further evaluation with nonemergent/outpatient ultrasound is recommended.  4. Small hiatal hernia.  5. Additional findings, as described above.  Ultrasound pelvis on 09/08/2021: IMPRESSION:  --2.1 cm hypoechoic cystic lesion with minimal internal complexity is noted at the right inguinal region, corresponding to the cystic lesion appreciated on same-day CT abdomen pelvis. Findings are most consistent with a hydrocele of the canal of Nuck.   Assessment and Plan: This is a 66 y.o. 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  - Discussed with the patient was the canal of Nuck and how it is related to embryological formation.  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  She has developed a cyst which  is now causing more discomfort.  Discussed with patient the management plan for this would be very similar to her right inguinal hernia repair.  However given that this would be the very first time that I am dealing with a cyst in the canal of Nuck, I discussed with her that I would prefer to do this as an open approach so I can open the inguinal canal better, excised the hydrocele, and potentially fix an inguinal hernia.  The patient is in agreement with this. -Reviewed with her that my plan for an open right inguinal hernia repair and hydrocelectomy and discussed with her the surgery at length including the incision, the risks of bleeding, infection, injury to surrounding structures, that this would be an outpatient procedure, postoperative activity restrictions, pain control, and she is willing to proceed. - We will schedule her for surgery on 10/20/2021.  She will have to hold her aspirin for 5 days prior to surgery.  All of her questions have been answered.  I spent 55 minutes dedicated  to the care of this patient on the date of this encounter to include pre-visit review of records, face-to-face time with the patient discussing diagnosis and management, and any post-visit coordination of care.   Melvyn Neth, [facility]

## 2021-10-07 NOTE — Progress Notes (Signed)
10/07/2021  Reason for Visit:  Cyst of right canal of Nuck  Requesting Provider:  Clovia Cuff, MD  History of Present Illness: Danielle Ortega is a 66 y.o. female presenting for evaluation of a cyst of the canal of knock on the right groin.  The patient reports that she has had an area in the right groin for many years that was thought to be a potential hernia.  She noticed from time to time that she would have a aggravation of swelling there.  However more recently she has been having more issues in that area of the right groin.  She presented to [facility] emergency department on 09/08/2021 with a 3-day history of progressively worsening pain in her right lower quadrant.  She had initial concern that this could be related to appendicitis versus the hernia.  She had a CT scan of the abdomen pelvis which showed a cystic lesion in the right inguinal region.  This was followed by ultrasound of the pelvis which showed a 2.1 cm cystic lesion in the right inguinal region corresponding to a hydrocele of the canal of Nuck.  She unfortunately then developed COVID but has been improving from that standpoint.  The patient reports that more recently the pain has been improving as well but she reports intermittent episodes of discomfort or shooting pain in the right groin.  Denies any troubles with the left groin or other areas of her body.  On personal review of her imaging studies, she did have a CT scan in our system in April 2008 which did show a very small area of small amount of fluid and a very small right inguinal hernia.  I have also seen the images from the CT scan at [facility] done last month and can see the cystic structure in the right groin.  Past Medical History: Past Medical History:  Diagnosis Date   Allergy    Complication of anesthesia    extreme nausea with shoulder surgery   Hypertension    PONV (postoperative nausea and vomiting)      Past Surgical History: Past Surgical History:  Procedure  Laterality Date   COLONOSCOPY WITH PROPOFOL N/A 01/12/2018   Procedure: COLONOSCOPY WITH PROPOFOL;  Surgeon: Virgel Manifold, MD;  Location: [facility] ENDOSCOPY;  Service: Endoscopy;  Laterality: N/A;   SHOULDER ARTHROSCOPY Left    TONSILLECTOMY      Home Medications: Prior to Admission medications   Medication Sig Start Date End Date Taking? Authorizing Provider  aspirin 81 MG tablet Take 1 tablet (81 mg total) by mouth daily. 05/05/17  Yes [name], [name], MD  Biotin 5000 MCG CAPS Take by mouth.   Yes [provider]  cetirizine (ZYRTEC) 10 MG tablet TAKE 1 TABLET BY MOUTH  DAILY 09/29/21  Yes Gwyneth Sprout, FNP  clonazePAM (KLONOPIN) 0.5 MG tablet TAKE 1 TABLET BY MOUTH AT  BEDTIME AS NEEDED FOR  ANXIETY 02/26/21  Yes [name], [name], MD  Cranberry 425 MG CAPS Take by mouth.   Yes [provider]  Javier Docker Oil 1000 MG CAPS Take by mouth.   Yes [provider]  Magnesium 100 MG TABS Take by mouth.   Yes [provider]  Multiple Vitamin (MULTIVITAMIN) tablet Take 1 tablet by mouth daily.   Yes [provider]  triamterene-hydrochlorothiazide (DYAZIDE) 37.5-25 MG capsule TAKE 1 CAPSULE BY MOUTH  DAILY 09/02/21  Yes Ostwalt, Janna, PA-C  VITAMIN E PO Take 16,000 Units daily by mouth.   Yes [provider]    Allergies: Allergies  Allergen Reactions   Hydrocodone Other (See Comments)    Dizziness and ineffective    Social History:  reports that she has never smoked. She has never used smokeless tobacco. She reports current alcohol use of about 2.0 standard drinks of alcohol per week. She reports that she does not use drugs.   Family History: Family History  Problem Relation Age of Onset   Lung cancer Mother    Hypertension Father    Heart attack Father    Breast cancer Neg Hx     Review of Systems: Review of Systems  Constitutional:  Negative for chills and fever.  HENT:  Negative for hearing loss.   Respiratory:   Negative for cough.   Cardiovascular:  Negative for chest pain.  Gastrointestinal:  Positive for abdominal pain. Negative for nausea and vomiting.  Genitourinary:  Negative for dysuria.  Musculoskeletal:  Negative for myalgias.  Skin:  Negative for rash.  Neurological:  Negative for dizziness.  Psychiatric/Behavioral:  Negative for depression.     Physical Exam BP 129/85   Pulse 90   Temp 98.7 F (37.1 C) (Oral)   Ht 5' (1.524 m)   Wt 144 lb 3.2 oz (65.4 kg)   SpO2 98%   BMI 28.16 kg/m  CONSTITUTIONAL: No acute distress, well-nourished HEENT:  Normocephalic, atraumatic, extraocular motion intact. NECK: Trachea is midline, and there is no jugular venous distension.  RESPIRATORY:  Lungs are clear, and breath sounds are equal bilaterally. Normal respiratory effort without pathologic use of accessory muscles. CARDIOVASCULAR: Heart is regular without murmurs, gallops, or rubs. GI: The abdomen is soft, nondistended, with tenderness to palpation in the medial portion of the groin towards the external ring.  Cannot discern a true hernia, but there is a small palpable mass towards the external ring that is more accentuated with coughing/straining.  No left inguinal hernia. MUSCULOSKELETAL:  Normal muscle strength and tone in all four extremities.  No peripheral edema or cyanosis. SKIN: Skin turgor is normal. There are no pathologic skin lesions.  NEUROLOGIC:  Motor and sensation is grossly normal.  Cranial nerves are grossly intact. PSYCH:  Alert and oriented to person, place and time. Affect is normal.  Laboratory Analysis: Labs from 09/11/21: Sodium 141, potassium 3.6, chloride 106, CO2 27.2, BUN 7, creatinine 0.81.  Total bilirubin 0.7, AST 25, ALT 34, alkaline phosphatase 51.  WBC 5.3, hemoglobin 15.7, hematocrit 44.7, platelets 161.  Imaging: CT abdomen/pelvis on 09/08/2021: IMPRESSION:  1. Hepatic steatosis. There is herniation of the right hepatic dome through the diaphragm.  2.  Trace free fluid in the pelvis, presumably physiologic.  3. Nonspecific cystic lesion in the right inguinal region. Clinical correlation is advised. Further evaluation with nonemergent/outpatient ultrasound is recommended.  4. Small hiatal hernia.  5. Additional findings, as described above.  Ultrasound pelvis on 09/08/2021: IMPRESSION:  --2.1 cm hypoechoic cystic lesion with minimal internal complexity is noted at the right inguinal region, corresponding to the cystic lesion appreciated on same-day CT abdomen pelvis. Findings are most consistent with a hydrocele of the canal of Nuck.   Assessment and Plan: This is a 66 y.o. 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  - Discussed with the patient was the canal of Nuck and how it is related to embryological formation.  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  She has developed a cyst which  is now causing more discomfort.  Discussed with patient the management plan for this would be very similar to her right inguinal hernia repair.  However given that this would be the very first time that I am dealing with a cyst in the canal of Nuck, I discussed with her that I would prefer to do this as an open approach so I can open the inguinal canal better, excised the hydrocele, and potentially fix an inguinal hernia.  The patient is in agreement with this. -Reviewed with her that my plan for an open right inguinal hernia repair and hydrocelectomy and discussed with her the surgery at length including the incision, the risks of bleeding, infection, injury to surrounding structures, that this would be an outpatient procedure, postoperative activity restrictions, pain control, and she is willing to proceed. - We will schedule her for surgery on 10/20/2021.  She will have to hold her aspirin for 5 days prior to surgery.  All of her questions have been answered.  I spent 55 minutes dedicated  to the care of this patient on the date of this encounter to include pre-visit review of records, face-to-face time with the patient discussing diagnosis and management, and any post-visit coordination of care.   Melvyn Neth, [facility]

## 2021-10-07 NOTE — Patient Instructions (Addendum)
Your LAST dose of Aspirin will be on 10/14/2021.  Our surgery scheduler Pamala Hurry will call you within 24-48 hours to get you scheduled. If you have not heard from her after 48 hours, please call our office. Have the blue sheet available when she calls to write down important information.  If you have any concerns or questions, please feel free to call our office.  Inguinal Hernia, Adult An inguinal hernia is when fat or your intestines push through a weak spot in a muscle where your leg meets your lower belly (groin). This causes a bulge. This kind of hernia could also be: In your scrotum, if you are female. In folds of skin around your vagina, if you are female. There are three types of inguinal hernias: Hernias that can be pushed back into the belly (are reducible). This type rarely causes pain. Hernias that cannot be pushed back into the belly (are incarcerated). Hernias that cannot be pushed back into the belly and lose their blood supply (are strangulated). This type needs emergency surgery. What are the causes? This condition is caused by having a weak spot in the muscles or tissues in your groin. This develops over time. The hernia may poke through the weak spot when you strain your lower belly muscles all of a sudden, such as when you: Lift a heavy object. Strain to poop (have a bowel movement). Trouble pooping (constipation) can lead to straining. Cough. What increases the risk? This condition is more likely to develop in: Males. Pregnant females. People who: Are overweight. Work in jobs that require long periods of standing or heavy lifting. Have had an inguinal hernia before. Smoke or have lung disease. These factors can lead to long-term (chronic) coughing. What are the signs or symptoms? Symptoms may depend on the size of the hernia. Often, a small hernia has no symptoms. Symptoms of a larger hernia may include: A bulge in the groin area. This is easier to see when standing. You  might not be able to see it when you are lying down. Pain or burning in the groin. This may get worse when you lift, strain, or cough. A dull ache or a feeling of pressure in the groin. An abnormal bulge in the scrotum, in males. Symptoms of a strangulated inguinal hernia may include: A bulge in your groin that is very painful and tender to the touch. A bulge that turns red or purple. Fever, feeling like you may vomit (nausea), and vomiting. Not being able to poop or to pass gas. How is this treated? Treatment depends on the size of your hernia and whether you have symptoms. If you do not have symptoms, your doctor may have you watch your hernia carefully and have you come in for follow-up visits. If your hernia is large or if you have symptoms, you may need surgery to repair the hernia. Follow these instructions at home: Lifestyle Avoid lifting heavy objects. Avoid standing for long amounts of time. Do not smoke or use any products that contain nicotine or tobacco. If you need help quitting, ask your doctor. Stay at a healthy weight. Prevent trouble pooping You may need to take these actions to prevent or treat trouble pooping: Drink enough fluid to keep your pee (urine) pale yellow. Take over-the-counter or prescription medicines. Eat foods that are high in fiber. These include beans, whole grains, and fresh fruits and vegetables. Limit foods that are high in fat and sugar. These include fried or sweet foods. General instructions You  may try to push your hernia back in place by very gently pressing on it when you are lying down. Do not try to push the bulge back in if it will not go in easily. Watch your hernia for any changes in shape, size, or color. Tell your doctor if you see any changes. Take over-the-counter and prescription medicines only as told by your doctor. Keep all follow-up visits. Contact a doctor if: You have a fever or chills. You have new symptoms. Your symptoms get  worse. Get help right away if: You have pain in your groin that gets worse all of a sudden. You have a bulge in your groin that: Gets bigger all of a sudden, and it does not get smaller after that. Turns red or purple. Is painful when you touch it. You are a female, and you have: Sudden pain in your scrotum. A sudden change in the size of your scrotum. You cannot push the hernia back in place by very gently pressing on it when you are lying down. You feel like you may vomit, and that feeling does not go away. You keep vomiting. You have a fast heartbeat. You cannot poop or pass gas. These symptoms may be an emergency. Get help right away. Call your local emergency services (911 in the [place] Do not wait to see if the symptoms will go away. Do not drive yourself to the hospital. Summary An inguinal hernia is when fat or your intestines push through a weak spot in a muscle where your leg meets your lower belly (groin). This causes a bulge. If you do not have symptoms, you may not need treatment. If you have symptoms or a large hernia, you may need surgery. Avoid lifting heavy objects. Also, avoid standing for long amounts of time. Do not try to push the bulge back in if it will not go in easily. This information is not intended to replace advice given to you by your health care provider. Make sure you discuss any questions you have with your health care provider. Document Revised: 10/09/2019 Document Reviewed: 10/09/2019 Elsevier Patient Education  [place].

## 2021-10-08 ENCOUNTER — Ambulatory Visit: Payer: Medicare Other

## 2021-10-08 ENCOUNTER — Telehealth: Payer: Self-pay | Admitting: Surgery

## 2021-10-08 DIAGNOSIS — N859 Noninflammatory disorder of uterus, unspecified: Secondary | ICD-10-CM

## 2021-10-08 DIAGNOSIS — R9389 Abnormal findings on diagnostic imaging of other specified body structures: Secondary | ICD-10-CM

## 2021-10-08 NOTE — Telephone Encounter (Signed)
Patient calls back, she is now informed of all dates for surgery and verbalized understanding.

## 2021-10-08 NOTE — Telephone Encounter (Signed)
Outgoing call, left message for patient to call.  Please inform her of the following scheduled surgery:   Pre-Admission date/time, and Surgery date.  Surgery Date: 10/20/21 @ [facility] Preadmission Testing Date: 10/15/21 (phone 1p-5p)  Also patient will need to call at [phone], between 1-3:00pm the day before surgery, to find out what time to arrive for surgery.

## 2021-10-15 ENCOUNTER — Encounter
Admission: RE | Admit: 2021-10-15 | Discharge: 2021-10-15 | Disposition: A | Discharge status: Home / Self Care | Payer: 59 | Source: Ambulatory Visit | Attending: Surgery | Admitting: Surgery

## 2021-10-15 VITALS — Ht 58.5 in | Wt 144.0 lb

## 2021-10-15 DIAGNOSIS — I10 Essential (primary) hypertension: Secondary | ICD-10-CM

## 2021-10-15 NOTE — Patient Instructions (Addendum)
Your procedure is scheduled on: Tuesday October 20, 2021. Report to Day Surgery inside [facility] 2nd floor, stop by admissions desk before getting on elevator. To find out your arrival time please call [phone] between 1PM - 3PM on Monday October 19, 2021.  Remember: Instructions that are not followed completely may result in serious medical risk,  up to and including death, or upon the discretion of your surgeon and anesthesiologist your  surgery may need to be rescheduled.     _X__ 1. Do not eat food after midnight the night before your procedure.                 No chewing gum or hard candies. You may drink clear liquids up to 2 hours                 before you are scheduled to arrive for your surgery- DO not drink clear                 liquids within 2 hours of the start of your surgery.                 Clear Liquids include:  water, apple juice without pulp, Black Coffee or Tea (Do not add                 anything to coffee or tea).  __X__2.  On the morning of surgery brush your teeth with toothpaste and water, you                may rinse your mouth with mouthwash if you wish.  Do not swallow any toothpaste or mouthwash.     _X__ 3.  No Alcohol for 24 hours before or after surgery.   _X__ 4.  Do Not Smoke or use e-cigarettes For 24 Hours Prior to Your Surgery.                 Do not use any chewable tobacco products for at least 6 hours prior to                 Surgery.  _X__  5.  Do not use any recreational drugs (marijuana, cocaine, heroin, ecstasy, MDMA or other)                For at least one week prior to your surgery.  Combination of these drugs with anesthesia                May have life threatening results.  ____  6.  Bring all medications with you on the day of surgery if instructed.   __X__  7.  Notify your doctor if there is any change in your medical condition      (cold, fever, infections).     Do not wear jewelry, make-up, hairpins,  clips or nail polish. Do not wear lotions, powders, or perfumes. You may wear deodorant. Do not shave 48 hours prior to surgery. Men may shave face and neck. Do not bring valuables to the hospital.    [facility] is not responsible for any belongings or valuables.  Contacts, dentures or bridgework may not be worn into surgery. Leave your suitcase in the car. After surgery it may be brought to your room. For patients admitted to the hospital, discharge time is determined by your treatment team.   Patients discharged the day of surgery will not be allowed to drive home.   Make arrangements for someone  to be with you for the first 24 hours of your Same Day Discharge.   __X__ Take these medicines the morning of surgery with A SIP OF WATER:    1. None   2.   3.   4.  5.  6.  ____ Fleet Enema (as directed)   __X__ Use CHG Soap (or wipes) as directed  ____ Use Benzoyl Peroxide Gel as instructed  ____ Use inhalers on the day of surgery  ____ Stop metformin 2 days prior to surgery    ____ Take 1/2 of usual insulin dose the night before surgery. No insulin the morning          of surgery.   ____ Call your PCP, cardiologist, or Pulmonologist if taking Coumadin/Plavix/aspirin and ask when to stop before your surgery.   __X__ One Week prior to surgery- Stop Anti-inflammatories such as Ibuprofen, Aleve, Advil, Motrin, meloxicam (MOBIC), diclofenac, etodolac, ketorolac, Toradol, Daypro, piroxicam, Goody's or BC powders. OK TO USE TYLENOL IF NEEDED   __X__ Stop ALL vitamins and or supplements until after surgery.    ____ Bring C-Pap to the hospital.    If you have any questions regarding your pre-procedure instructions,  Please call Pre-admit Testing at [phone]

## 2021-10-19 ENCOUNTER — Ambulatory Visit: Payer: Medicare Other | Admitting: Obstetrics & Gynecology

## 2021-10-19 ENCOUNTER — Encounter
Admission: RE | Admit: 2021-10-19 | Discharge: 2021-10-19 | Disposition: A | Discharge status: Home / Self Care | Payer: 59 | Source: Ambulatory Visit | Attending: Surgery | Admitting: Surgery

## 2021-10-19 DIAGNOSIS — I10 Essential (primary) hypertension: Secondary | ICD-10-CM

## 2021-10-19 DIAGNOSIS — Z01810 Encounter for preprocedural cardiovascular examination: Secondary | ICD-10-CM | POA: Diagnosis present

## 2021-10-19 MED ORDER — CHLORHEXIDINE GLUCONATE CLOTH 2 % EX PADS: 6.0000 | MEDICATED_PAD | Freq: Once | CUTANEOUS | Status: DC

## 2021-10-19 MED ORDER — LACTATED RINGERS IV SOLN: INTRAVENOUS | Status: DC

## 2021-10-19 MED ORDER — CHLORHEXIDINE GLUCONATE 0.12 % MT SOLN: 15.0000 mL | Freq: Once | OROMUCOSAL | Status: DC

## 2021-10-19 MED ORDER — GABAPENTIN 300 MG PO CAPS: 300.0000 mg | ORAL_CAPSULE | ORAL | Status: AC

## 2021-10-19 MED ORDER — ORAL CARE MOUTH RINSE: 15.0000 mL | Freq: Once | OROMUCOSAL | Status: DC

## 2021-10-19 MED ORDER — FAMOTIDINE 20 MG PO TABS: 20.0000 mg | ORAL_TABLET | Freq: Once | ORAL | Status: AC

## 2021-10-19 MED ORDER — ACETAMINOPHEN 500 MG PO TABS: 1000.0000 mg | ORAL_TABLET | ORAL | Status: AC

## 2021-10-19 MED ORDER — BUPIVACAINE LIPOSOME 1.3 % IJ SUSP: 20.0000 mL | Freq: Once | INTRAMUSCULAR | Status: DC

## 2021-10-19 MED ORDER — CEFAZOLIN SODIUM-DEXTROSE 2-4 GM/100ML-% IV SOLN
2.0000 g | INTRAVENOUS | Status: AC
  Administered 2021-10-20: 2 g via INTRAVENOUS

## 2021-10-20 ENCOUNTER — Ambulatory Visit: Payer: 59 | Admitting: Certified Registered Nurse Anesthetist

## 2021-10-20 ENCOUNTER — Encounter: Payer: Self-pay | Admitting: Surgery

## 2021-10-20 ENCOUNTER — Other Ambulatory Visit: Payer: Self-pay

## 2021-10-20 ENCOUNTER — Encounter
Admission: RE | Disposition: A | Discharge status: Home / Self Care | Payer: Self-pay | Source: Home / Self Care | Attending: Surgery

## 2021-10-20 ENCOUNTER — Ambulatory Visit
Admission: RE | Admit: 2021-10-20 | Discharge: 2021-10-20 | Disposition: A | Discharge status: Home / Self Care | Payer: 59 | Attending: Surgery | Admitting: Surgery

## 2021-10-20 DIAGNOSIS — K4090 Unilateral inguinal hernia, without obstruction or gangrene, not specified as recurrent: Secondary | ICD-10-CM | POA: Diagnosis not present

## 2021-10-20 DIAGNOSIS — I10 Essential (primary) hypertension: Secondary | ICD-10-CM | POA: Insufficient documentation

## 2021-10-20 DIAGNOSIS — Q524 Other congenital malformations of vagina: Secondary | ICD-10-CM | POA: Diagnosis not present

## 2021-10-20 DIAGNOSIS — K219 Gastro-esophageal reflux disease without esophagitis: Secondary | ICD-10-CM | POA: Insufficient documentation

## 2021-10-20 DIAGNOSIS — N9489 Other specified conditions associated with female genital organs and menstrual cycle: Secondary | ICD-10-CM | POA: Diagnosis present

## 2021-10-20 HISTORY — PX: INGUINAL HERNIA REPAIR: SHX194

## 2021-10-20 HISTORY — PX: HYDROCELE EXCISION: SHX482

## 2021-10-20 SURGERY — REPAIR, HERNIA, INGUINAL, ADULT
Anesthesia: General | Site: Inguinal | Laterality: Right

## 2021-10-20 MED ORDER — ONDANSETRON HCL 4 MG/2ML IJ SOLN
INTRAMUSCULAR | Status: AC
  Filled 2021-10-20: qty 2

## 2021-10-20 MED ORDER — SCOPOLAMINE 1 MG/3DAYS TD PT72: 1.0000 | MEDICATED_PATCH | TRANSDERMAL | Status: DC

## 2021-10-20 MED ORDER — KETOROLAC TROMETHAMINE 30 MG/ML IJ SOLN
INTRAMUSCULAR | Status: DC | PRN
  Administered 2021-10-20: 30 mg via INTRAVENOUS

## 2021-10-20 MED ORDER — GABAPENTIN 300 MG PO CAPS
ORAL_CAPSULE | ORAL | Status: AC
  Administered 2021-10-20: 300 mg via ORAL
  Filled 2021-10-20: qty 1

## 2021-10-20 MED ORDER — PROPOFOL 10 MG/ML IV BOLUS
INTRAVENOUS | Status: AC
  Filled 2021-10-20: qty 20

## 2021-10-20 MED ORDER — FENTANYL CITRATE (PF) 100 MCG/2ML IJ SOLN
INTRAMUSCULAR | Status: AC
  Filled 2021-10-20: qty 2

## 2021-10-20 MED ORDER — PROPOFOL 500 MG/50ML IV EMUL
INTRAVENOUS | Status: DC | PRN
  Administered 2021-10-20: 150 ug/kg/min via INTRAVENOUS

## 2021-10-20 MED ORDER — LIDOCAINE HCL (CARDIAC) PF 100 MG/5ML IV SOSY
PREFILLED_SYRINGE | INTRAVENOUS | Status: DC | PRN
  Administered 2021-10-20: 60 mg via INTRAVENOUS

## 2021-10-20 MED ORDER — ROCURONIUM BROMIDE 10 MG/ML (PF) SYRINGE
PREFILLED_SYRINGE | INTRAVENOUS | Status: AC
  Filled 2021-10-20: qty 10

## 2021-10-20 MED ORDER — PROPOFOL 10 MG/ML IV BOLUS
INTRAVENOUS | Status: DC | PRN
  Administered 2021-10-20: 120 mg via INTRAVENOUS

## 2021-10-20 MED ORDER — CEFAZOLIN SODIUM-DEXTROSE 2-4 GM/100ML-% IV SOLN
INTRAVENOUS | Status: AC
  Filled 2021-10-20: qty 100

## 2021-10-20 MED ORDER — FENTANYL CITRATE (PF) 100 MCG/2ML IJ SOLN
INTRAMUSCULAR | Status: DC | PRN
  Administered 2021-10-20: 25 ug via INTRAVENOUS
  Administered 2021-10-20: 50 ug via INTRAVENOUS

## 2021-10-20 MED ORDER — FAMOTIDINE 20 MG PO TABS
ORAL_TABLET | ORAL | Status: AC
  Administered 2021-10-20: 20 mg via ORAL
  Filled 2021-10-20: qty 1

## 2021-10-20 MED ORDER — OXYCODONE HCL 5 MG PO TABS: 5.0000 mg | ORAL_TABLET | ORAL | 0 refills | Status: DC | PRN

## 2021-10-20 MED ORDER — BUPIVACAINE HCL (PF) 0.25 % IJ SOLN
INTRAMUSCULAR | Status: AC
  Filled 2021-10-20: qty 30

## 2021-10-20 MED ORDER — CHLORHEXIDINE GLUCONATE 0.12 % MT SOLN
OROMUCOSAL | Status: AC
  Filled 2021-10-20: qty 15

## 2021-10-20 MED ORDER — BUPIVACAINE LIPOSOME 1.3 % IJ SUSP
INTRAMUSCULAR | Status: AC
  Filled 2021-10-20: qty 20

## 2021-10-20 MED ORDER — ROCURONIUM BROMIDE 100 MG/10ML IV SOLN
INTRAVENOUS | Status: DC | PRN
  Administered 2021-10-20: 50 mg via INTRAVENOUS

## 2021-10-20 MED ORDER — BUPIVACAINE-EPINEPHRINE (PF) 0.5% -1:200000 IJ SOLN
INTRAMUSCULAR | Status: AC
  Filled 2021-10-20: qty 30

## 2021-10-20 MED ORDER — DEXAMETHASONE SODIUM PHOSPHATE 10 MG/ML IJ SOLN
INTRAMUSCULAR | Status: DC | PRN
  Administered 2021-10-20: 10 mg via INTRAVENOUS

## 2021-10-20 MED ORDER — SCOPOLAMINE 1 MG/3DAYS TD PT72
MEDICATED_PATCH | TRANSDERMAL | Status: AC
  Administered 2021-10-20: 1.5 mg via TRANSDERMAL
  Filled 2021-10-20: qty 1

## 2021-10-20 MED ORDER — OXYCODONE HCL 5 MG PO TABS
5.0000 mg | ORAL_TABLET | Freq: Once | ORAL | Status: AC | PRN
  Administered 2021-10-20: 5 mg via ORAL

## 2021-10-20 MED ORDER — LIDOCAINE HCL (PF) 2 % IJ SOLN
INTRAMUSCULAR | Status: AC
  Filled 2021-10-20: qty 5

## 2021-10-20 MED ORDER — FENTANYL CITRATE (PF) 100 MCG/2ML IJ SOLN
25.0000 ug | INTRAMUSCULAR | Status: DC | PRN
  Administered 2021-10-20 (×2): 50 ug via INTRAVENOUS

## 2021-10-20 MED ORDER — ACETAMINOPHEN 10 MG/ML IV SOLN: 1000.0000 mg | Freq: Once | INTRAVENOUS | Status: DC | PRN

## 2021-10-20 MED ORDER — PROPOFOL 1000 MG/100ML IV EMUL
INTRAVENOUS | Status: AC
  Filled 2021-10-20: qty 100

## 2021-10-20 MED ORDER — SUGAMMADEX SODIUM 200 MG/2ML IV SOLN
INTRAVENOUS | Status: DC | PRN
  Administered 2021-10-20: 150 mg via INTRAVENOUS

## 2021-10-20 MED ORDER — OXYCODONE HCL 5 MG/5ML PO SOLN: 5.0000 mg | Freq: Once | ORAL | Status: AC | PRN

## 2021-10-20 MED ORDER — ONDANSETRON HCL 4 MG/2ML IJ SOLN
INTRAMUSCULAR | Status: DC | PRN
  Administered 2021-10-20: 4 mg via INTRAVENOUS

## 2021-10-20 MED ORDER — 0.9 % SODIUM CHLORIDE (POUR BTL) OPTIME
TOPICAL | Status: DC | PRN
  Administered 2021-10-20: 100 mL

## 2021-10-20 MED ORDER — IBUPROFEN 600 MG PO TABS: 600.0000 mg | ORAL_TABLET | Freq: Three times a day (TID) | ORAL | 1 refills | Status: DC | PRN

## 2021-10-20 MED ORDER — ONDANSETRON HCL 4 MG/2ML IJ SOLN
4.0000 mg | Freq: Once | INTRAMUSCULAR | Status: DC | PRN
Start: 2021-10-20 — End: 2021-10-20

## 2021-10-20 MED ORDER — KETOROLAC TROMETHAMINE 30 MG/ML IJ SOLN
INTRAMUSCULAR | Status: AC
  Filled 2021-10-20: qty 1

## 2021-10-20 MED ORDER — MIDAZOLAM HCL 2 MG/2ML IJ SOLN
INTRAMUSCULAR | Status: DC | PRN
  Administered 2021-10-20: 1 mg via INTRAVENOUS

## 2021-10-20 MED ORDER — ACETAMINOPHEN 500 MG PO TABS
ORAL_TABLET | ORAL | Status: AC
  Administered 2021-10-20: 1000 mg via ORAL
  Filled 2021-10-20: qty 1

## 2021-10-20 MED ORDER — LACTATED RINGERS IV SOLN: INTRAVENOUS | Status: DC

## 2021-10-20 MED ORDER — DEXAMETHASONE SODIUM PHOSPHATE 10 MG/ML IJ SOLN
INTRAMUSCULAR | Status: AC
  Filled 2021-10-20: qty 1

## 2021-10-20 MED ORDER — BUPIVACAINE-EPINEPHRINE 0.5% -1:200000 IJ SOLN
INTRAMUSCULAR | Status: DC | PRN
  Administered 2021-10-20: 50 mL

## 2021-10-20 MED ORDER — OXYCODONE HCL 5 MG PO TABS
ORAL_TABLET | ORAL | Status: AC
  Filled 2021-10-20: qty 1

## 2021-10-20 MED ORDER — MIDAZOLAM HCL 2 MG/2ML IJ SOLN
INTRAMUSCULAR | Status: AC
  Filled 2021-10-20: qty 2

## 2021-10-20 MED ORDER — ACETAMINOPHEN 500 MG PO TABS: 1000.0000 mg | ORAL_TABLET | Freq: Four times a day (QID) | ORAL | Status: AC | PRN

## 2021-10-20 SURGICAL SUPPLY — 41 items
ADH SKN CLS APL DERMABOND .7 (GAUZE/BANDAGES/DRESSINGS) ×2
APL PRP STRL LF DISP 70% ISPRP (MISCELLANEOUS) ×2
BLADE SURG 15 STRL LF DISP TIS (BLADE) ×3 IMPLANT
BLADE SURG 15 STRL SS (BLADE) ×2
CHLORAPREP W/TINT 26 (MISCELLANEOUS) ×3 IMPLANT
DERMABOND ADVANCED (GAUZE/BANDAGES/DRESSINGS) ×2
DERMABOND ADVANCED .7 DNX12 (GAUZE/BANDAGES/DRESSINGS) ×3 IMPLANT
DRAIN PENROSE 12X.25 LTX STRL (MISCELLANEOUS) ×3 IMPLANT
DRAPE LAPAROTOMY 100X77 ABD (DRAPES) ×3 IMPLANT
ELECT CAUTERY BLADE TIP 2.5 (TIP) ×2
ELECT REM PT RETURN 9FT ADLT (ELECTROSURGICAL) ×2
ELECTRODE CAUTERY BLDE TIP 2.5 (TIP) ×3 IMPLANT
ELECTRODE REM PT RTRN 9FT ADLT (ELECTROSURGICAL) ×3 IMPLANT
GAUZE 4X4 16PLY ~~LOC~~+RFID DBL (SPONGE) ×3 IMPLANT
GLOVE SURG SYN 7.0 (GLOVE) ×2 IMPLANT
GLOVE SURG SYN 7.0 PF PI (GLOVE) ×3 IMPLANT
GLOVE SURG SYN 7.5  E (GLOVE) ×2
GLOVE SURG SYN 7.5 E (GLOVE) ×2 IMPLANT
GLOVE SURG SYN 7.5 PF PI (GLOVE) ×3 IMPLANT
GOWN STRL REUS W/ TWL LRG LVL3 (GOWN DISPOSABLE) ×6 IMPLANT
GOWN STRL REUS W/TWL LRG LVL3 (GOWN DISPOSABLE) ×4
LABEL OR SOLS (LABEL) ×3 IMPLANT
MANIFOLD NEPTUNE II (INSTRUMENTS) ×3 IMPLANT
MESH HERNIA 1.6X1.9 PLUG LRG (Mesh General) ×3 IMPLANT
MESH HERNIA PLUG LRG (Mesh General) ×2 IMPLANT
NEEDLE HYPO 22GX1.5 SAFETY (NEEDLE) ×3 IMPLANT
NS IRRIG 500ML POUR BTL (IV SOLUTION) ×3 IMPLANT
PACK BASIN MINOR ARMC (MISCELLANEOUS) ×3 IMPLANT
SPONGE T-LAP 18X18 ~~LOC~~+RFID (SPONGE) ×3 IMPLANT
SUT MNCRL 4-0 (SUTURE) ×2
SUT MNCRL 4-0 27XMFL (SUTURE) ×2
SUT PROLENE 2 0 SH DA (SUTURE) ×6 IMPLANT
SUT SILK 2 0 (SUTURE) ×2
SUT SILK 2-0 18XBRD TIE 12 (SUTURE) IMPLANT
SUT VIC AB 2-0 CT1 (SUTURE) ×3 IMPLANT
SUT VIC AB 3-0 SH 27 (SUTURE) ×2
SUT VIC AB 3-0 SH 27X BRD (SUTURE) ×3 IMPLANT
SUTURE MNCRL 4-0 27XMF (SUTURE) ×3 IMPLANT
SYR 10ML LL (SYRINGE) ×3 IMPLANT
SYR 30ML LL (SYRINGE) ×3 IMPLANT
SYR BULB IRRIG 60ML STRL (SYRINGE) ×3 IMPLANT

## 2021-10-20 NOTE — Anesthesia Preprocedure Evaluation (Addendum)
Anesthesia Evaluation  Patient identified by MRN, date of birth, ID band Patient awake    Reviewed: Allergy & Precautions, NPO status , Patient's Chart, lab work & pertinent test results  History of Anesthesia Complications (+) PONV and history of anesthetic complications  Airway Mallampati: II   Neck ROM: Full    Dental no notable dental hx.    Pulmonary neg pulmonary ROS,    Pulmonary exam normal breath sounds clear to auscultation       Cardiovascular hypertension, Normal cardiovascular exam Rhythm:Regular Rate:Normal  ECG 10/19/21: normal   Neuro/Psych Seizures - (last in 1987),  HOH    GI/Hepatic GERD  ,  Endo/Other  negative endocrine ROS  Renal/GU negative Renal ROS     Musculoskeletal   Abdominal   Peds  Hematology negative hematology ROS (+)   Anesthesia Other Findings   Reproductive/Obstetrics                            Anesthesia Physical Anesthesia Plan  ASA: 2  Anesthesia Plan: General   Post-op Pain Management:    Induction: Intravenous  PONV Risk Score and Plan: 4 or greater and Ondansetron, Dexamethasone, Treatment may vary due to age or medical condition, Scopolamine patch - Pre-op and TIVA  Airway Management Planned: Oral ETT  Additional Equipment:   Intra-op Plan:   Post-operative Plan: Extubation in OR  Informed Consent: I have reviewed the patients History and Physical, chart, labs and discussed the procedure including the risks, benefits and alternatives for the proposed anesthesia with the patient or authorized representative who has indicated his/her understanding and acceptance.     Dental advisory given  Plan Discussed with: CRNA  Anesthesia Plan Comments: (Patient consented for risks of anesthesia including but not limited to:  - adverse reactions to medications - damage to eyes, teeth, lips or other oral mucosa - nerve damage due to  positioning  - sore throat or hoarseness - damage to heart, brain, nerves, lungs, other parts of body or loss of life  Informed patient about role of CRNA in peri- and intra-operative care.  Patient voiced understanding.)       Anesthesia Quick Evaluation

## 2021-10-20 NOTE — Transfer of Care (Signed)
Immediate Anesthesia Transfer of Care Note  Patient: Danielle Ortega  Procedure(s) Performed: HERNIA REPAIR INGUINAL ADULT, open (Right: Inguinal) HYDROCELECTOMY ADULT (Inguinal)  Patient Location: PACU  Anesthesia Type:General  Level of Consciousness: drowsy  Airway & Oxygen Therapy: Patient Spontanous Breathing and Patient connected to face mask oxygen  Post-op Assessment: Report given to RN and Post -op Vital signs reviewed and stable  Post vital signs: Reviewed and stable  Last Vitals:  Vitals Value Taken Time  BP 152/103 10/20/21 1215  Temp    Pulse 77 10/20/21 1219  Resp 12 10/20/21 1219  SpO2 98 % 10/20/21 1219  Vitals shown include unvalidated device data.  Last Pain:  Vitals:   10/20/21 0920  PainSc: 0-No pain         Complications: No notable events documented.

## 2021-10-20 NOTE — Discharge Instructions (Signed)
AMBULATORY SURGERY  ?DISCHARGE INSTRUCTIONS ? ? ?The drugs that you were given will stay in your system until tomorrow so for the next 24 hours you should not: ? ?Drive an automobile ?Make any legal decisions ?Drink any alcoholic beverage ? ? ?You may resume regular meals tomorrow.  Today it is better to start with liquids and gradually work up to solid foods. ? ?You may eat anything you prefer, but it is better to start with liquids, then soup and crackers, and gradually work up to solid foods. ? ? ?Please notify your doctor immediately if you have any unusual bleeding, trouble breathing, redness and pain at the surgery site, drainage, fever, or pain not relieved by medication. ? ? ? ?Additional Instructions: ? ? ? ?Please contact your physician with any problems or Same Day Surgery at [phone], Monday through Friday 6 am to 4 pm, or [place] [facility] [place] [facility] number at [phone].  ?

## 2021-10-20 NOTE — Op Note (Signed)
  Procedure Date:  10/20/2021  Pre-operative Diagnosis:  Right cyst of the canal of Nuck  Post-operative Diagnosis: Right cyst of the canal of Nuck, right inguinal hernia  Procedure:  Right hydrocelectomy and Inguinal Hernia Repair  Surgeon:  Melvyn Neth, MD  Anesthesia:  General endotracheal  Estimated Blood Loss:  5 ml  Specimens:  Right hydrocele  Complications:  None  Indications for Procedure:  This is a 66 y.o. female who presents with a right cyst of the canal of Nuck, causing pain in the right groin.  The options of surgery versus observation were reviewed with the patient and/or family. The risks of bleeding, abscess or infection, recurrence of symptoms, potential for an open procedure, injury to surrounding structures, and chronic pain were all discussed with the patient and was willing to proceed.  Description of Procedure: The patient was correctly identified in the preoperative area and brought into the operating room.  The patient was placed supine with VTE prophylaxis in place.  Appropriate time-outs were performed.  Anesthesia was induced and the patient was intubated.  Appropriate antibiotics were infused.  The right groin was evaluated using bedside ultrasound and the cyst structure was found towards the external inguinal ring.  This could be tracked proximally to the internal ring consistent with a small hernia defect.  The right groin and lower abdomen were prepped and draped in a sterile fashion. An oblique incision was made between the pubic symphysis extending laterally toward the ASIS. Using electrocautery, the subcutaneous tissues were dissected, assuring adequate hemostasis, until reaching the external oblique aponeurosis.  A 1 cm incision was made over the aponeurosis and extended laterally and medially toward the external inguinal ring avoiding injury to the ilioinguinal nerve.  The cyst and canal were identified and dissected free.  One of the inguinal nerves  was adhered to the cyst sac and had to be ligated and cut.  The cyst sac and canal were dissected free to the internal ring, then the sac was opened and suctioned clear fluid, and ligated at its base using 2-0 Silk.  The sac stump and nerve stump were pushed into the abdominal cavity and covered with muscle using 2-0 Prolene.  A Bard mesh was then placed and sutured to the pubic tubercle, shelving edge, and conjoined tendon with interrupted 2-0 Prolene sutures.  The tails of the mesh were crossed laterally and sutured together.  The external oblique was then closed in running fashion with 2-0 Vicryl, creating a new external ring.  50 ml of Exparel solution mixed with 0.5% bupivacaine with epi was infiltrated over the subcutaneous tissue, fascia, and as a right ilioinguinal block.  The wound was irrigated and the incision was closed in three layers with interrupted 2-0 Vicryl, 3-0 Vicryl and running 4-0 Monocryl.  The wound was cleaned and sealed with DermaBond.  The patient was emerged from anesthesia and extubated and brought to the recovery room for further management.  The patient tolerated the procedure well and all counts were correct at the end of the case.   Melvyn Neth, MD

## 2021-10-20 NOTE — Interval H&P Note (Signed)
History and Physical Interval Note:  10/20/2021 10:04 AM  Danielle Ortega  has presented today for surgery, with the diagnosis of Cyst of right canal of nuck.  The various methods of treatment have been discussed with the patient and family. After consideration of risks, benefits and other options for treatment, the patient has consented to  Procedure(s): HERNIA REPAIR INGUINAL ADULT, open (Right) HYDROCELECTOMY ADULT (N/A) as a surgical intervention.  The patient's history has been reviewed, patient examined, no change in status, stable for surgery.  I have reviewed the patient's chart and labs.  Questions were answered to the patient's satisfaction.     Talyia Allende

## 2021-10-20 NOTE — Anesthesia Postprocedure Evaluation (Signed)
Anesthesia Post Note  Patient: KEEGAN BENSCH  Procedure(s) Performed: HERNIA REPAIR INGUINAL ADULT, open (Right: Inguinal) HYDROCELECTOMY ADULT (Inguinal)  Patient location during evaluation: PACU Anesthesia Type: General Level of consciousness: awake and alert, oriented and patient cooperative Pain management: pain level controlled Vital Signs Assessment: post-procedure vital signs reviewed and stable Respiratory status: spontaneous breathing, nonlabored ventilation and respiratory function stable Cardiovascular status: blood pressure returned to baseline and stable Postop Assessment: adequate PO intake Anesthetic complications: no   No notable events documented.   Last Vitals:  Vitals:   10/20/21 1300 10/20/21 1310  BP: 112/80 (!) 157/79  Pulse: 61 63  Resp: 13 16  Temp:  (!) 36.2 C  SpO2: 98% 99%    Last Pain:  Vitals:   10/20/21 1310  TempSrc: Temporal  PainSc: 2                  Darrin Nipper

## 2021-10-20 NOTE — Anesthesia Procedure Notes (Signed)
Procedure Name: Intubation Date/Time: 10/20/2021 10:36 AM  Performed by: Tollie Eth, CRNAPre-anesthesia Checklist: Patient identified, Patient being monitored, Timeout performed, Emergency Drugs available and Suction available Patient Re-evaluated:Patient Re-evaluated prior to induction Oxygen Delivery Method: Circle system utilized Preoxygenation: Pre-oxygenation with 100% oxygen Induction Type: IV induction Ventilation: Mask ventilation without difficulty Laryngoscope Size: 3 and McGraph Grade View: Grade I Tube type: Oral Tube size: 7.0 mm Number of attempts: 1 Airway Equipment and Method: Video-laryngoscopy Placement Confirmation: ETT inserted through vocal cords under direct vision, positive ETCO2 and breath sounds checked- equal and bilateral Secured at: 21 cm Tube secured with: Tape Dental Injury: Teeth and Oropharynx as per pre-operative assessment

## 2021-10-21 ENCOUNTER — Encounter: Payer: Self-pay | Admitting: Surgery

## 2021-10-21 LAB — SURGICAL PATHOLOGY

## 2021-10-22 ENCOUNTER — Ambulatory Visit: Payer: Medicare Other

## 2021-10-24 ENCOUNTER — Other Ambulatory Visit: Payer: Self-pay | Admitting: Family Medicine

## 2021-10-24 DIAGNOSIS — K2100 Gastro-esophageal reflux disease with esophagitis, without bleeding: Secondary | ICD-10-CM

## 2021-11-02 ENCOUNTER — Ambulatory Visit: Payer: Medicare Other | Admitting: Obstetrics & Gynecology

## 2021-11-03 ENCOUNTER — Ambulatory Visit: Payer: 59 | Admitting: Family Medicine

## 2021-11-03 ENCOUNTER — Encounter: Payer: Self-pay | Admitting: Physician Assistant

## 2021-11-03 ENCOUNTER — Ambulatory Visit (INDEPENDENT_AMBULATORY_CARE_PROVIDER_SITE_OTHER): Payer: 59 | Admitting: Physician Assistant

## 2021-11-03 ENCOUNTER — Encounter: Payer: Self-pay | Admitting: Family Medicine

## 2021-11-03 VITALS — BP 124/81 | HR 84 | Temp 98.9°F | Wt 144.8 lb

## 2021-11-03 VITALS — BP 115/79 | HR 82 | Temp 99.0°F | Wt 145.0 lb

## 2021-11-03 DIAGNOSIS — I10 Essential (primary) hypertension: Secondary | ICD-10-CM

## 2021-11-03 DIAGNOSIS — Z23 Encounter for immunization: Secondary | ICD-10-CM

## 2021-11-03 DIAGNOSIS — Q524 Other congenital malformations of vagina: Secondary | ICD-10-CM

## 2021-11-03 DIAGNOSIS — K2100 Gastro-esophageal reflux disease with esophagitis, without bleeding: Secondary | ICD-10-CM

## 2021-11-03 DIAGNOSIS — F5101 Primary insomnia: Secondary | ICD-10-CM

## 2021-11-03 DIAGNOSIS — J301 Allergic rhinitis due to pollen: Secondary | ICD-10-CM

## 2021-11-03 DIAGNOSIS — R102 Pelvic and perineal pain: Secondary | ICD-10-CM | POA: Diagnosis not present

## 2021-11-03 DIAGNOSIS — E7800 Pure hypercholesterolemia, unspecified: Secondary | ICD-10-CM

## 2021-11-03 DIAGNOSIS — K4090 Unilateral inguinal hernia, without obstruction or gangrene, not specified as recurrent: Secondary | ICD-10-CM

## 2021-11-03 DIAGNOSIS — Z09 Encounter for follow-up examination after completed treatment for conditions other than malignant neoplasm: Secondary | ICD-10-CM

## 2021-11-03 NOTE — Patient Instructions (Signed)

## 2021-11-03 NOTE — Assessment & Plan Note (Signed)
Doing well on current regimen, no changes made today. Obtaining labs.

## 2021-11-03 NOTE — Progress Notes (Signed)
   SUBJECTIVE:   CHIEF COMPLAINT / HPI:   Hypertension: - Medications: dyazide - Compliance: good - Checking BP at home: no - Denies any SOB, CP, vision changes, LE edema, medication SEs, or symptoms of hypotension  Allergies - on zyrtec in winter. Doing well.   Anxiety - taking 0.'5mg'$  klonopin every night, doing well on reduced dose. Stressors include job. Helping with sleep also.  GERD - Meds: omeprazole, not taking - symptoms improved after stopped eating after 4pm.  - Symptoms:  none - Previous treatment: H2 antagonists and proton pump inhibitors.  Recent inguinal hernia repair - 10/20/21. Has surgical f/u appt later today. Hasn't had to take much of the oxycodone.   OBJECTIVE:   BP 115/79 (BP Location: Left Arm, Patient Position: Sitting, Cuff Size: Large)   Pulse 82   Temp 99 F (37.2 C) (Oral)   Wt 145 lb (65.8 kg)   BMI 29.79 kg/m   Gen: well appearing, in NAD Card: RRR Lungs: CTAB Ext: WWP, no edema   ASSESSMENT/PLAN:   Essential hypertension, benign Doing well on current regimen, no changes made today. Obtaining labs.  Esophagitis, reflux Doing well s/p lifestyle changes. Counseled on avoiding known triggers. Would also likely benefit from weight loss.   Insomnia Doing well on decreased klonopin dose. Per chart review, did not do well on trazodone or melatonin. Consider addition of SSRI if symptoms worsen as stress seems to contribute.  Pure hypercholesterolemia Recheck labs.   Right inguinal hernia Doing well s/p repair. Continue to follow with surgery.     Myles Gip, DO

## 2021-11-03 NOTE — Progress Notes (Signed)
[facility] POST-OP OFFICE VISIT  11/03/2021  HPI: Danielle Ortega is a 66 y.o. female 14 days s/p Right hydrocelectomy and Inguinal Hernia Repair with Dr Hampton Abbot   She is doing well She had some discomfort for the first 2-4 days but that has subsided; no longer needing any pain medication No fever, chills, nausea, emesis No issues with the incisions She is ambulating well Moving her bowels  Vital signs: BP 124/81   Pulse 84   Temp 98.9 F (37.2 C) (Oral)   Wt 144 lb 12.8 oz (65.7 kg)   SpO2 97%   BMI 29.75 kg/m    Physical Exam: Constitutional: Well appearing female, NAD Abdomen: Soft, non-tender, non-distended, no rebound/guarding Skin: Right inguinal incision is healing well, no erythema or drainage   Assessment/Plan: This is a 66 y.o. female' \\14'$  days s/p Right hydrocelectomy and Inguinal Hernia Repair with Dr Hampton Abbot    - Pain control prn  - Reviewed wound care recommendation  - Reviewed lifting restrictions; 6 weeks total ideally (4 more weeks)  - Reviewed surgical pathology; Hydrocele, negative for malignancy   - She can follow up on as needed basis; She understands to call with questions/concerns  -- Edison Simon, PA-C [facility] 11/03/2021, 2:32 PM M-F: 7am - 4pm

## 2021-11-03 NOTE — Patient Instructions (Signed)
Please call [facility] to schedule your annual routine mammogram [phone]  

## 2021-11-03 NOTE — Assessment & Plan Note (Signed)
Doing well s/p repair. Continue to follow with surgery.

## 2021-11-03 NOTE — Assessment & Plan Note (Signed)
Doing well s/p lifestyle changes. Counseled on avoiding known triggers. Would also likely benefit from weight loss.

## 2021-11-03 NOTE — Assessment & Plan Note (Signed)
Recheck labs 

## 2021-11-03 NOTE — Assessment & Plan Note (Signed)
Doing well on decreased klonopin dose. Per chart review, did not do well on trazodone or melatonin. Consider addition of SSRI if symptoms worsen as stress seems to contribute.

## 2021-11-04 LAB — BASIC METABOLIC PANEL
BUN/Creatinine Ratio: 15 (ref 12–28)
BUN: 15 mg/dL (ref 8–27)
CO2: 24 mmol/L (ref 20–29)
Calcium: 10.6 mg/dL — ABNORMAL HIGH (ref 8.7–10.3)
Chloride: 106 mmol/L (ref 96–106)
Creatinine, Ser: 1 mg/dL (ref 0.57–1.00)
Glucose: 131 mg/dL — ABNORMAL HIGH (ref 70–99)
Potassium: 4.4 mmol/L (ref 3.5–5.2)
Sodium: 149 mmol/L — ABNORMAL HIGH (ref 134–144)
eGFR: 61 mL/min/{1.73_m2} (ref >59)

## 2021-11-04 LAB — LIPID PANEL
Chol/HDL Ratio: 3.2 ratio (ref 0.0–4.4)
Cholesterol, Total: 188 mg/dL (ref 100–199)
HDL: 58 mg/dL (ref >39)
LDL Chol Calc (NIH): 98 mg/dL (ref 0–99)
Triglycerides: 189 mg/dL — ABNORMAL HIGH (ref 0–149)
VLDL Cholesterol Cal: 32 mg/dL (ref 5–40)

## 2021-11-05 ENCOUNTER — Other Ambulatory Visit: Payer: Self-pay | Admitting: Physician Assistant

## 2021-11-05 ENCOUNTER — Other Ambulatory Visit: Payer: Self-pay | Admitting: Family Medicine

## 2021-11-05 DIAGNOSIS — I10 Essential (primary) hypertension: Secondary | ICD-10-CM

## 2021-11-05 DIAGNOSIS — G4700 Insomnia, unspecified: Secondary | ICD-10-CM

## 2021-11-06 ENCOUNTER — Other Ambulatory Visit: Payer: Self-pay | Admitting: Physician Assistant

## 2021-11-06 DIAGNOSIS — F419 Anxiety disorder, unspecified: Secondary | ICD-10-CM

## 2021-11-06 MED ORDER — CLONAZEPAM 0.5 MG PO TABS: 0.5000 mg | ORAL_TABLET | Freq: Every day | ORAL | 0 refills | Status: DC

## 2021-11-12 ENCOUNTER — Encounter: Payer: Self-pay | Admitting: Family Medicine

## 2021-11-12 DIAGNOSIS — F419 Anxiety disorder, unspecified: Secondary | ICD-10-CM

## 2021-11-12 MED ORDER — CLONAZEPAM 0.5 MG PO TABS: 0.5000 mg | ORAL_TABLET | Freq: Every day | ORAL | 0 refills | Status: DC

## 2021-11-12 NOTE — Telephone Encounter (Signed)
Prescription clonazepam canceled at [facility].

## 2021-11-18 IMAGING — US US CAROTID DUPLEX BILAT
1 series · 13 of 24 positions shown · non-contrast
Comparison: None.

CLINICAL DATA: Amaurosis fugax

EXAM:
BILATERAL CAROTID DUPLEX ULTRASOUND
TECHNIQUE: Gray scale imaging, color Doppler and duplex ultrasound were
performed of bilateral carotid and vertebral arteries in the neck.

[Series 1: us carotid duplex bilat · 13 of 61 slices shown]
[im 1/61]
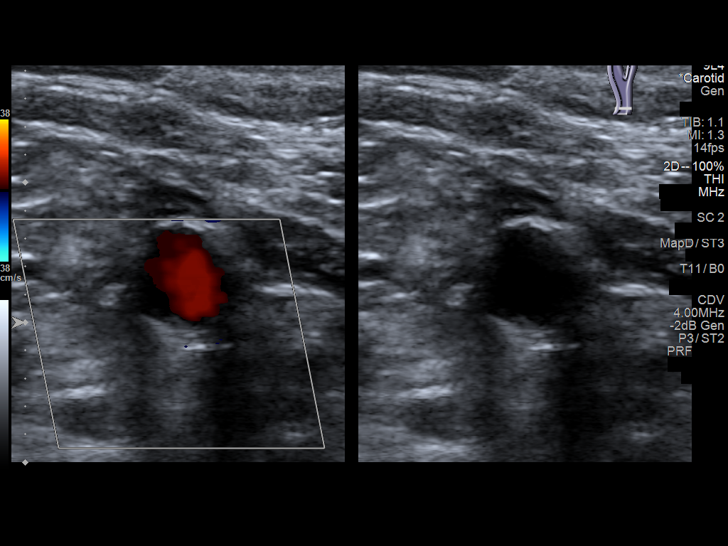
[im 6/61]
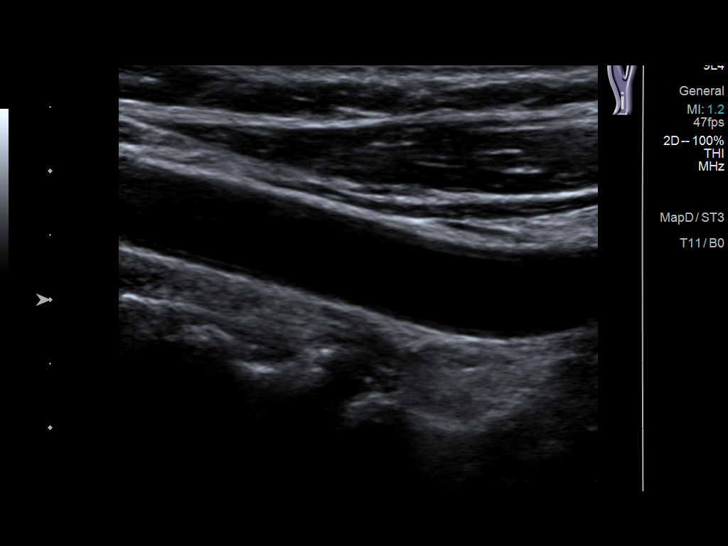
[im 11/61]
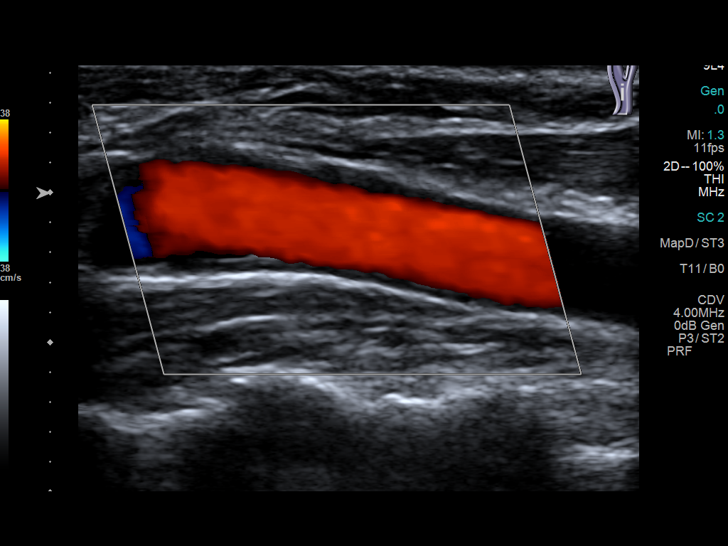
[im 16/61]
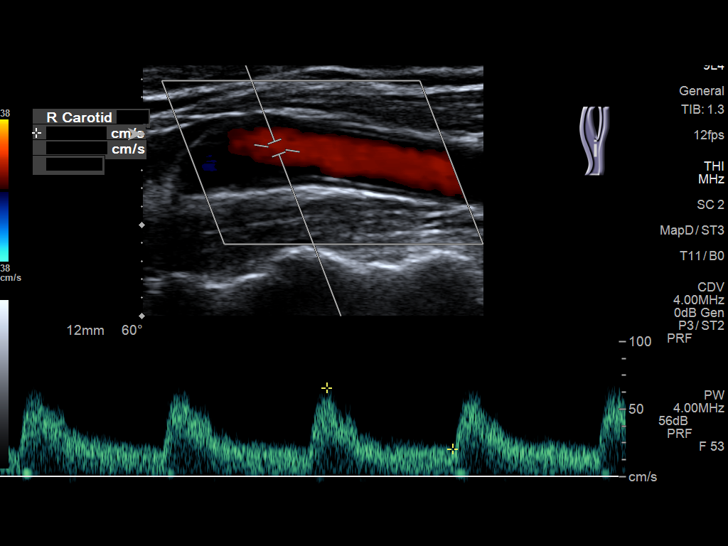
[im 21/61]
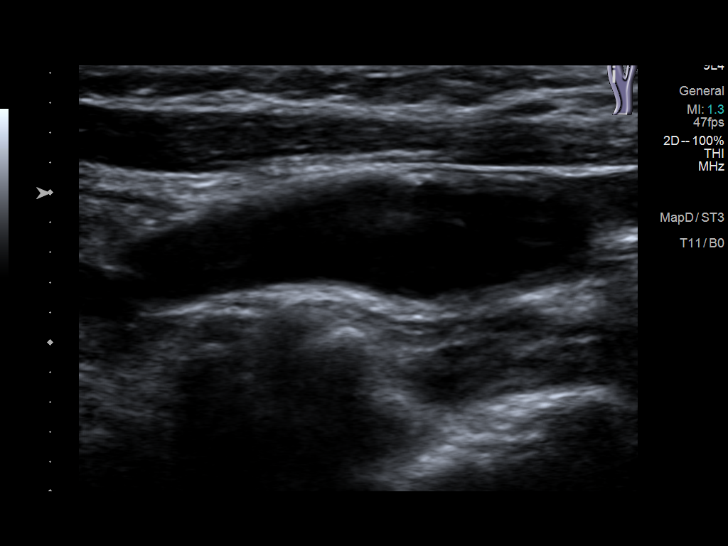
[im 27/61]
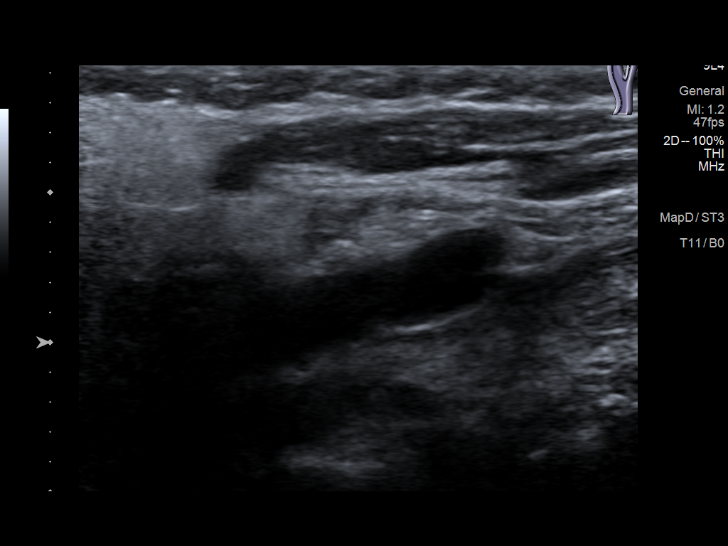
[im 32/61]
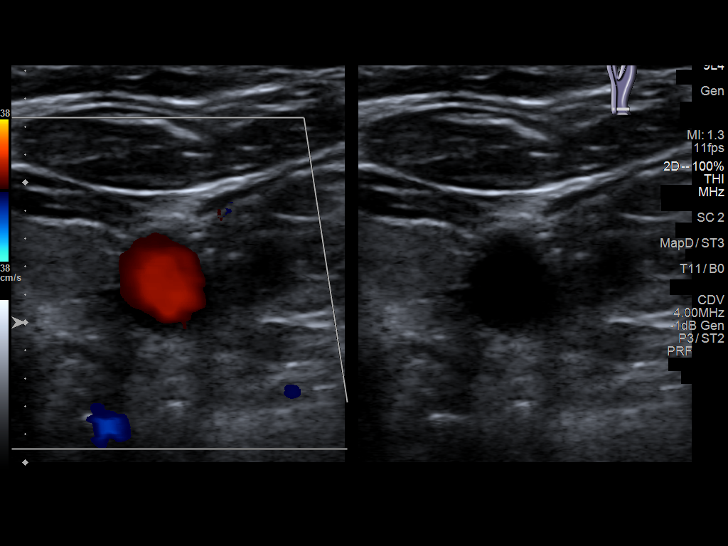
[im 34/61]
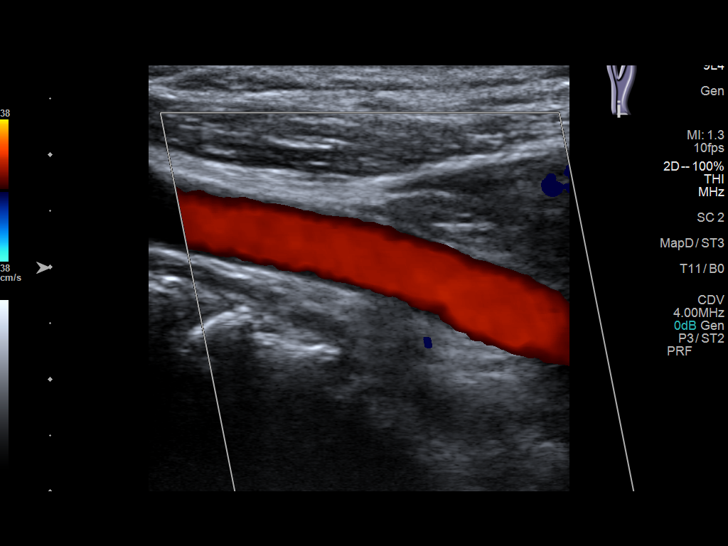
[im 40/61]
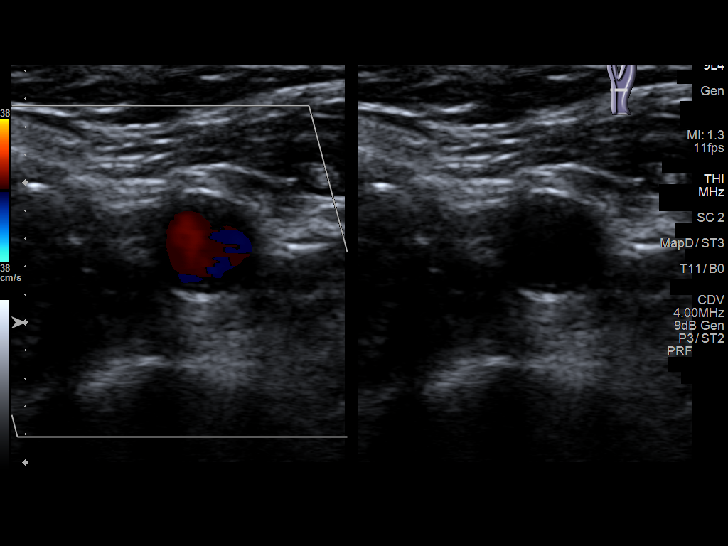
[im 45/61]
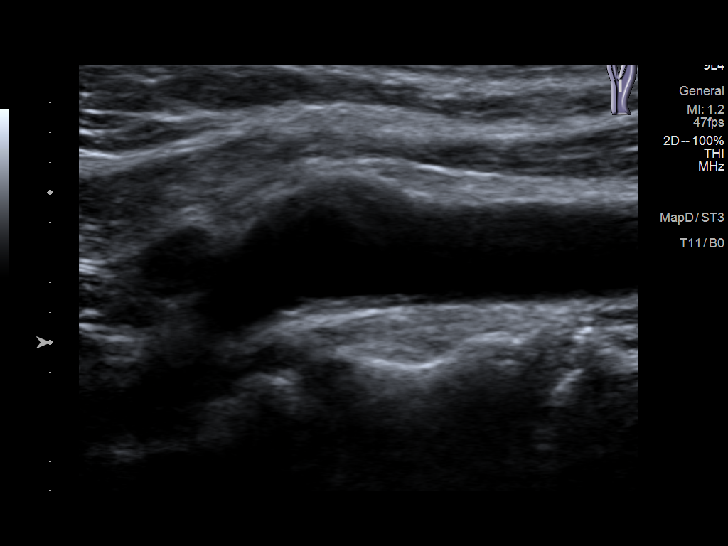
[im 50/61]
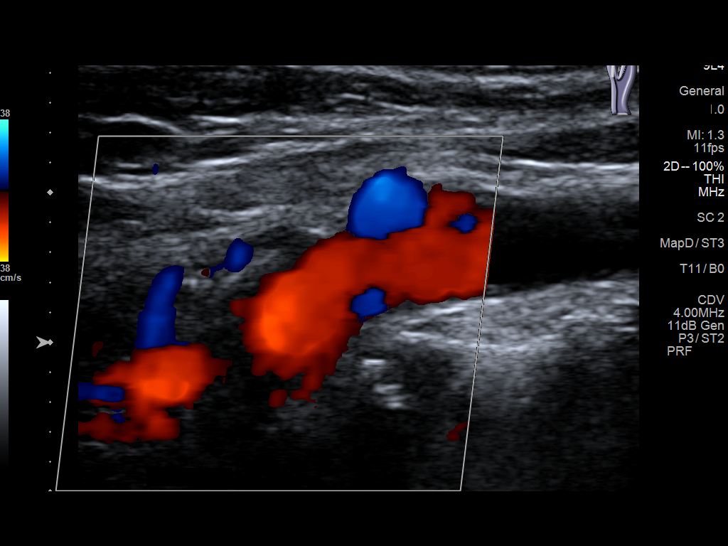
[im 55/61]
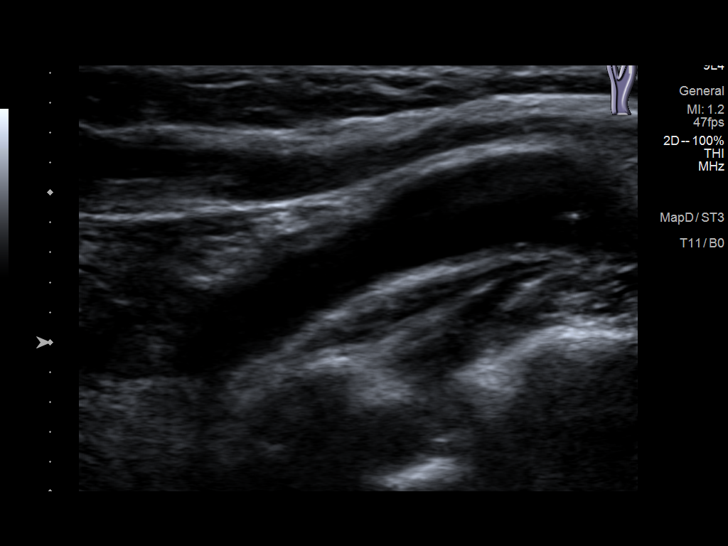
[im 61/61]
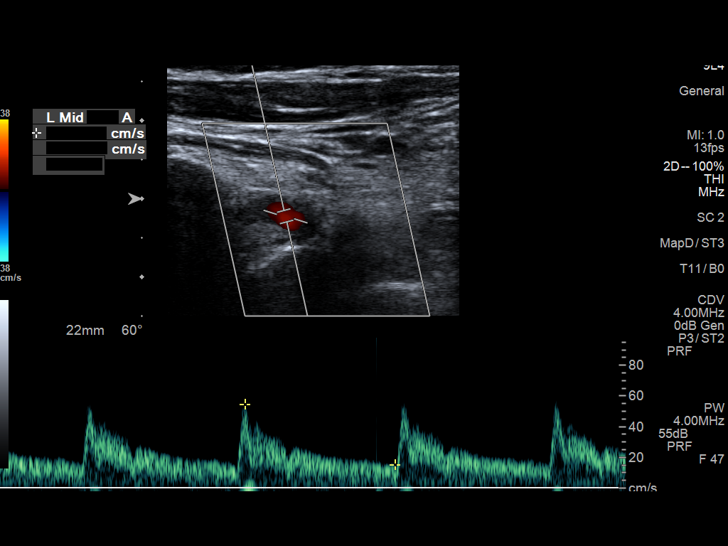

[13 of 24 positions shown; findings below may reference images not displayed]

FINDINGS: Criteria: Quantification of carotid stenosis is based on velocity
parameters that correlate the residual internal carotid diameter
with NASCET-based stenosis levels, using the diameter of the distal
internal carotid lumen as the denominator for stenosis measurement.

The following velocity measurements were obtained:

RIGHT

ICA: 65/23 cm/sec

CCA: 97/23 cm/sec

SYSTOLIC ICA/CCA RATIO:

ECA: 85 cm/sec

LEFT

ICA: 72/31 cm/sec

CCA: 81/20 cm/sec

SYSTOLIC ICA/CCA RATIO:

ECA: 89 cm/sec

RIGHT CAROTID ARTERY: Intimal thickening and trace hypoechoic plaque
formation. Negative for stenosis, velocity elevation, or turbulent
flow. Degree of narrowing less than 50% by ultrasound criteria.

RIGHT VERTEBRAL ARTERY:  Normal antegrade flow

LEFT CAROTID ARTERY: Similar minor intimal thickening/trace
atherosclerosis. Negative for stenosis, velocity elevation, or
turbulent flow. Degree of narrowing less than 50% by ultrasound
criteria.

LEFT VERTEBRAL ARTERY:  Normal antegrade flow
IMPRESSION: Minor carotid atherosclerosis. Negative for stenosis. Degree of
narrowing less than 50% bilaterally by ultrasound criteria.

Patent antegrade vertebral flow bilaterally

## 2022-03-02 ENCOUNTER — Ambulatory Visit: Payer: 59 | Admitting: Family Medicine

## 2022-03-02 ENCOUNTER — Encounter: Payer: Self-pay | Admitting: Family Medicine

## 2022-03-02 VITALS — BP 108/80 | HR 67 | Ht 58.5 in | Wt 151.0 lb

## 2022-03-02 DIAGNOSIS — I10 Essential (primary) hypertension: Secondary | ICD-10-CM

## 2022-03-02 DIAGNOSIS — Z23 Encounter for immunization: Secondary | ICD-10-CM | POA: Diagnosis not present

## 2022-03-02 DIAGNOSIS — Z0000 Encounter for general adult medical examination without abnormal findings: Secondary | ICD-10-CM | POA: Diagnosis not present

## 2022-03-02 DIAGNOSIS — F5101 Primary insomnia: Secondary | ICD-10-CM

## 2022-03-02 DIAGNOSIS — Z1211 Encounter for screening for malignant neoplasm of colon: Secondary | ICD-10-CM

## 2022-03-02 DIAGNOSIS — Z1231 Encounter for screening mammogram for malignant neoplasm of breast: Secondary | ICD-10-CM

## 2022-03-02 DIAGNOSIS — E7800 Pure hypercholesterolemia, unspecified: Secondary | ICD-10-CM | POA: Diagnosis not present

## 2022-03-02 MED ORDER — TRIAMTERENE-HCTZ 37.5-25 MG PO CAPS
1.0000 | ORAL_CAPSULE | Freq: Every day | ORAL | 3 refills | Status: DC
Start: 2022-03-02 — End: 2022-09-02

## 2022-03-02 MED ORDER — CLONAZEPAM 0.5 MG PO TABS: 0.5000 mg | ORAL_TABLET | Freq: Every day | ORAL | 1 refills | Status: DC

## 2022-03-02 NOTE — Assessment & Plan Note (Signed)
Well controlled Continue current medications Recheck metabolic panel F/u in 6 months  

## 2022-03-02 NOTE — Progress Notes (Signed)
Complete physical exam  Patient: Danielle Ortega   DOB: 17-Dec-1955   67 y.o. Female  MRN: [id number]  Subjective:    Chief Complaint  Patient presents with   Annual Exam    Danielle Ortega is a 67 y.o. female who presents today for a complete physical exam. She reports consuming a general diet. Exercising on elliptical multiple times weekly. She generally feels fairly well. She reports sleeping fairly well. She does not have additional problems to discuss today.    Most recent fall risk assessment:    11/03/2021    1:34 PM  Fall Risk   Falls in the past year? 0  Number falls in past yr: 0  Injury with Fall? 0  Risk for fall due to : No Fall Risks  Follow up Falls evaluation completed     Most recent depression screenings:    11/03/2021    1:34 PM 02/26/2021    1:27 PM  PHQ 2/9 Scores  PHQ - 2 Score 0 0  PHQ- 9 Score 3     Past Medical History:  Diagnosis Date   Allergy    Complication of anesthesia    extreme nausea with shoulder surgery   Hypertension    PONV (postoperative nausea and vomiting)       Patient Care Team: Virginia Crews, MD as PCP - General (Family Medicine)   Outpatient Medications Prior to Visit  Medication Sig   acetaminophen (TYLENOL) 500 MG tablet Take 2 tablets (1,000 mg total) by mouth every 6 (six) hours as needed for mild pain.   aspirin 81 MG tablet Take 1 tablet (81 mg total) by mouth daily.   Biotin 5000 MCG CAPS Take by mouth.   cetirizine (ZYRTEC) 10 MG tablet TAKE 1 TABLET BY MOUTH  DAILY   Cranberry 425 MG CAPS Take by mouth.   ibuprofen (ADVIL) 600 MG tablet Take 1 tablet (600 mg total) by mouth every 8 (eight) hours as needed for moderate pain.   Krill Oil 1000 MG CAPS Take by mouth.   Magnesium 100 MG TABS Take by mouth.   Multiple Vitamin (MULTIVITAMIN) tablet Take 1 tablet by mouth daily.   omeprazole (PRILOSEC) 20 MG capsule TAKE 1 CAPSULE BY MOUTH  DAILY   VITAMIN E PO Take 16,000 Units daily by mouth.    [DISCONTINUED] clonazePAM (KLONOPIN) 0.5 MG tablet Take 1 tablet (0.5 mg total) by mouth daily.   [DISCONTINUED] triamterene-hydrochlorothiazide (DYAZIDE) 37.5-25 MG capsule TAKE 1 CAPSULE BY MOUTH DAILY   No facility-administered medications prior to visit.    Review of Systems  All other systems reviewed and are negative.     Objective:     BP 108/80 (BP Location: Right Arm, Patient Position: Sitting, Cuff Size: Normal)   Pulse 67   Ht 4' 10.5" (1.486 m)   Wt 151 lb (68.5 kg)   SpO2 100%   BMI 31.02 kg/m  BP Readings from Last 3 Encounters:  03/02/22 108/80  11/03/21 124/81  11/03/21 115/79   Wt Readings from Last 3 Encounters:  03/02/22 151 lb (68.5 kg)  11/03/21 144 lb 12.8 oz (65.7 kg)  11/03/21 145 lb (65.8 kg)      Physical Exam Vitals reviewed.  Constitutional:      General: She is not in acute distress.    Appearance: Normal appearance. She is well-developed. She is not diaphoretic.  HENT:     Head: Normocephalic and atraumatic.     Right Ear: Tympanic membrane, ear  canal and external ear normal.     Left Ear: Tympanic membrane, ear canal and external ear normal.     Nose: Nose normal.     Mouth/Throat:     Mouth: Mucous membranes are moist.     Pharynx: Oropharynx is clear. No oropharyngeal exudate.  Eyes:     General: No scleral icterus.    Conjunctiva/sclera: Conjunctivae normal.     Pupils: Pupils are equal, round, and reactive to light.  Neck:     Thyroid: No thyromegaly.  Cardiovascular:     Rate and Rhythm: Normal rate and regular rhythm.     Pulses: Normal pulses.     Heart sounds: Normal heart sounds. No murmur heard. Pulmonary:     Effort: Pulmonary effort is normal. No respiratory distress.     Breath sounds: Normal breath sounds. No wheezing or rales.  Abdominal:     General: There is no distension.     Palpations: Abdomen is soft.     Tenderness: There is no abdominal tenderness.  Musculoskeletal:        General: No deformity.      Cervical back: Neck supple.     Right lower leg: No edema.     Left lower leg: No edema.  Lymphadenopathy:     Cervical: No cervical adenopathy.  Skin:    General: Skin is warm and dry.     Findings: No rash.  Neurological:     Mental Status: She is alert and oriented to person, place, and time. Mental status is at baseline.     Sensory: No sensory deficit.     Motor: No weakness.     Gait: Gait normal.  Psychiatric:        Mood and Affect: Mood normal.        Behavior: Behavior normal.        Thought Content: Thought content normal.      No results found for any visits on 03/02/22.     Assessment & Plan:    Routine Health Maintenance and Physical Exam  Immunization History  Administered Date(s) Administered   Fluad Quad(high Dose 65+) 10/18/2018, 12/10/2020, 11/03/2021   Influenza,inj,Quad PF,6+ Mos 12/27/2016, 12/28/2017, 01/23/2020   Moderna Covid-19 Vaccine Bivalent Booster 95yr & up 12/18/2020   PFIZER Comirnaty(Gray Top)Covid-19 Tri-Sucrose Vaccine 07/16/2020   PFIZER(Purple Top)SARS-COV-2 Vaccination 04/19/2019, 05/10/2019, 01/23/2020   Pneumococcal Conjugate-13 01/26/2019   Pneumococcal Polysaccharide-23 03/03/2020   Tdap 09/10/2011   Zoster Recombinat (Shingrix) 01/26/2019, 03/30/2019    Health Maintenance  Topic Date Due   MAMMOGRAM  01/10/2021   DTaP/Tdap/Td (2 - Td or Tdap) 09/09/2021   COVID-19 Vaccine (6 - 2023-24 season) 10/23/2021   COLONOSCOPY (Pts [id number] coverage will need to be confirmed)  01/13/2023   Pneumonia Vaccine 6546Years old  Completed   INFLUENZA VACCINE  Completed   DEXA SCAN  Completed   Hepatitis C Screening  Completed   Zoster Vaccines- Shingrix  Completed   HPV VACCINES  Aged Out    Discussed health benefits of physical activity, and encouraged her to engage in regular exercise appropriate for her age and condition.  Problem List Items Addressed This Visit       Cardiovascular and Mediastinum   Essential  hypertension, benign    Well controlled Continue current medications Recheck metabolic panel F/u in 6 months       Relevant Medications   triamterene-hydrochlorothiazide (DYAZIDE) 37.5-25 MG capsule   Other Relevant Orders   Lipid panel  Comprehensive metabolic panel     Other   Insomnia    Chronic and stable Failed trazodone or melatonin in the past  Still has occasional nights with decreased sleep Using klonopin regularly Have discussed long term risks with benzos      Pure hypercholesterolemia    Reviewed last lipid panel Not currently on a statin Recheck FLP and CMP Discussed diet and exercise       Relevant Medications   triamterene-hydrochlorothiazide (DYAZIDE) 37.5-25 MG capsule   Other Relevant Orders   Lipid panel   Comprehensive metabolic panel   Other Visit Diagnoses     Encounter for annual physical exam    -  Primary   Relevant Orders   Lipid panel   Comprehensive metabolic panel   Encounter for screening mammogram for malignant neoplasm of breast       Relevant Orders   MM 3D SCREEN BREAST BILATERAL   Colon cancer screening       Relevant Orders   Ambulatory referral to Gastroenterology       COVID booster today Advised of need for TDAP - will get at pharmacy   Return in about 6 months (around 08/31/2022) for chronic disease f/u.     Lavon Paganini, MD

## 2022-03-02 NOTE — Assessment & Plan Note (Signed)
Reviewed last lipid panel Not currently on a statin Recheck FLP and CMP Discussed diet and exercise  

## 2022-03-02 NOTE — Assessment & Plan Note (Signed)
Chronic and stable Failed trazodone or melatonin in the past  Still has occasional nights with decreased sleep Using klonopin regularly Have discussed long term risks with benzos

## 2022-03-03 LAB — COMPREHENSIVE METABOLIC PANEL
ALT: 31 IU/L (ref 0–32)
AST: 23 IU/L (ref 0–40)
Albumin/Globulin Ratio: 2.8 — ABNORMAL HIGH (ref 1.2–2.2)
Albumin: 4.7 g/dL (ref 3.9–4.9)
Alkaline Phosphatase: 47 IU/L (ref 44–121)
BUN/Creatinine Ratio: 18 (ref 12–28)
BUN: 15 mg/dL (ref 8–27)
Bilirubin Total: 0.4 mg/dL (ref 0.0–1.2)
CO2: 23 mmol/L (ref 20–29)
Calcium: 9.5 mg/dL (ref 8.7–10.3)
Chloride: 105 mmol/L (ref 96–106)
Creatinine, Ser: 0.84 mg/dL (ref 0.57–1.00)
Globulin, Total: 1.7 g/dL (ref 1.5–4.5)
Glucose: 94 mg/dL (ref 70–99)
Potassium: 4.3 mmol/L (ref 3.5–5.2)
Sodium: 143 mmol/L (ref 134–144)
Total Protein: 6.4 g/dL (ref 6.0–8.5)
eGFR: 76 mL/min/{1.73_m2} (ref >59)

## 2022-03-03 LAB — LIPID PANEL
Chol/HDL Ratio: 2.8 ratio (ref 0.0–4.4)
Cholesterol, Total: 185 mg/dL (ref 100–199)
HDL: 66 mg/dL (ref >39)
LDL Chol Calc (NIH): 102 mg/dL — ABNORMAL HIGH (ref 0–99)
Triglycerides: 97 mg/dL (ref 0–149)
VLDL Cholesterol Cal: 17 mg/dL (ref 5–40)

## 2022-03-04 ENCOUNTER — Other Ambulatory Visit: Payer: Self-pay

## 2022-03-04 ENCOUNTER — Telehealth: Payer: Self-pay

## 2022-03-04 DIAGNOSIS — Z86010 Personal history of colonic polyps: Secondary | ICD-10-CM

## 2022-03-04 MED ORDER — NA SULFATE-K SULFATE-MG SULF 17.5-3.13-1.6 GM/177ML PO SOLN: 1.0000 | Freq: Once | ORAL | 0 refills | Status: AC

## 2022-03-04 NOTE — Telephone Encounter (Signed)
Gastroenterology Pre-Procedure Review  Request Date: 01/28/23 Requesting Physician: Dr. Vicente Males  PATIENT REVIEW QUESTIONS: The patient responded to the following health history questions as indicated:    1. Are you having any GI issues? no 2. Do you have a personal history of Polyps? yes (last colonoscopy performed 01/12/18 by Dr. Bonna Gains) 3. Do you have a family history of Colon Cancer or Polyps? no 4. Diabetes Mellitus? no 5. Joint replacements in the past 12 months? No joint replacements but pt did have hernia repair surgery 10/20/21 performed by Dr. Hampton Abbot 6. Major health problems in the past 3 months?no 7. Any artificial heart valves, MVP, or defibrillator?no    MEDICATIONS & ALLERGIES:    Patient reports the following regarding taking any anticoagulation/antiplatelet therapy:   Plavix, Coumadin, Eliquis, Xarelto, Lovenox, Pradaxa, Brilinta, or Effient? no Aspirin? yes (81 mg daily)  Patient confirms/reports the following medications:  Current Outpatient Medications  Medication Sig Dispense Refill   acetaminophen (TYLENOL) 500 MG tablet Take 2 tablets (1,000 mg total) by mouth every 6 (six) hours as needed for mild pain.     aspirin 81 MG tablet Take 1 tablet (81 mg total) by mouth daily. 30 tablet 0   Biotin 5000 MCG CAPS Take by mouth.     cetirizine (ZYRTEC) 10 MG tablet TAKE 1 TABLET BY MOUTH  DAILY 90 tablet 3   clonazePAM (KLONOPIN) 0.5 MG tablet Take 1 tablet (0.5 mg total) by mouth daily. 90 tablet 1   Cranberry 425 MG CAPS Take by mouth.     ibuprofen (ADVIL) 600 MG tablet Take 1 tablet (600 mg total) by mouth every 8 (eight) hours as needed for moderate pain. 60 tablet 1   Krill Oil 1000 MG CAPS Take by mouth.     Magnesium 100 MG TABS Take by mouth.     Multiple Vitamin (MULTIVITAMIN) tablet Take 1 tablet by mouth daily.     omeprazole (PRILOSEC) 20 MG capsule TAKE 1 CAPSULE BY MOUTH  DAILY 90 capsule 3   triamterene-hydrochlorothiazide (DYAZIDE) 37.5-25 MG capsule  Take 1 each (1 capsule total) by mouth daily. 90 capsule 3   VITAMIN E PO Take 16,000 Units daily by mouth.     No current facility-administered medications for this visit.    Patient confirms/reports the following allergies:  Allergies  Allergen Reactions   Hydrocodone Other (See Comments)    Dizziness and ineffective    No orders of the defined types were placed in this encounter.   AUTHORIZATION INFORMATION Primary Insurance: 1D#: Group #:  Secondary Insurance: 1D#: Group #:  SCHEDULE INFORMATION: Date: 01/28/23 Time: Location: [facility]

## 2022-06-22 ENCOUNTER — Ambulatory Visit
Admission: RE | Admit: 2022-06-22 | Discharge: 2022-06-22 | Disposition: A | Discharge status: Home / Self Care | Payer: 59 | Source: Ambulatory Visit | Attending: Family Medicine | Admitting: Family Medicine

## 2022-06-22 DIAGNOSIS — Z1231 Encounter for screening mammogram for malignant neoplasm of breast: Secondary | ICD-10-CM | POA: Insufficient documentation

## 2022-08-04 ENCOUNTER — Other Ambulatory Visit: Payer: Self-pay | Admitting: Family Medicine

## 2022-09-02 ENCOUNTER — Encounter: Payer: Self-pay | Admitting: Family Medicine

## 2022-09-02 ENCOUNTER — Ambulatory Visit: Payer: 59 | Admitting: Family Medicine

## 2022-09-02 VITALS — BP 102/71 | HR 75 | Temp 98.2°F | Resp 12 | Ht 58.5 in | Wt 150.4 lb

## 2022-09-02 DIAGNOSIS — F419 Anxiety disorder, unspecified: Secondary | ICD-10-CM

## 2022-09-02 DIAGNOSIS — Z23 Encounter for immunization: Secondary | ICD-10-CM | POA: Diagnosis not present

## 2022-09-02 DIAGNOSIS — E7800 Pure hypercholesterolemia, unspecified: Secondary | ICD-10-CM

## 2022-09-02 DIAGNOSIS — I10 Essential (primary) hypertension: Secondary | ICD-10-CM | POA: Diagnosis not present

## 2022-09-02 DIAGNOSIS — K2100 Gastro-esophageal reflux disease with esophagitis, without bleeding: Secondary | ICD-10-CM

## 2022-09-02 DIAGNOSIS — F5101 Primary insomnia: Secondary | ICD-10-CM

## 2022-09-02 MED ORDER — TRIAMTERENE-HCTZ 37.5-25 MG PO CAPS
1.0000 | ORAL_CAPSULE | Freq: Every day | ORAL | 3 refills | Status: DC
Start: 2022-09-02 — End: 2023-03-07

## 2022-09-02 MED ORDER — CLONAZEPAM 0.5 MG PO TABS: 0.5000 mg | ORAL_TABLET | Freq: Every day | ORAL | 1 refills | Status: DC

## 2022-09-02 NOTE — Progress Notes (Signed)
I,Danielle Ortega,acting as a scribe for Shirlee Latch, MD.,have documented all relevant documentation on the behalf of Shirlee Latch, MD,as directed by  Shirlee Latch, MD while in the presence of Shirlee Latch, MD.     Established patient visit   Patient: Danielle Ortega   DOB: 01/13/1956   67 y.o. Female  MRN: [id number] Visit Date: 09/02/2022  Today's healthcare provider: Shirlee Latch, MD   Chief Complaint  Patient presents with   Medical Management of Chronic Issues   Subjective    HPI  Hypertension, follow-up  BP Readings from Last 3 Encounters:  09/02/22 102/71  03/02/22 108/80  11/03/21 124/81   Wt Readings from Last 3 Encounters:  09/02/22 150 lb 6.4 oz (68.2 kg)  03/02/22 151 lb (68.5 kg)  11/03/21 144 lb 12.8 oz (65.7 kg)     She was last seen for hypertension 6 months ago.  BP at that visit was 108/80. Management since that visit includes no changes.  She reports excellent compliance with treatment. She is not having side effects.   Outside blood pressures are not being checked. Symptoms: No chest pain No chest pressure  No palpitations No syncope  No dyspnea No orthopnea  No paroxysmal nocturnal dyspnea No lower extremity edema   Pertinent labs Lab Results  Component Value Date   CHOL 185 03/02/2022   HDL 66 03/02/2022   LDLCALC 102 (H) 03/02/2022   TRIG 97 03/02/2022   CHOLHDL 2.8 03/02/2022   Lab Results  Component Value Date   NA 143 03/02/2022   K 4.3 03/02/2022   CREATININE 0.84 03/02/2022   EGFR 76 03/02/2022   GLUCOSE 94 03/02/2022   TSH 3.220 12/29/2016     The 10-year ASCVD risk score (Arnett DK, et al., 2019) is: 6.8%  --------------------------------------------------------------------------------------------------- Lipid/Cholesterol, Follow-up  Last lipid panel Other pertinent labs  Lab Results  Component Value Date   CHOL 185 03/02/2022   HDL 66 03/02/2022   LDLCALC 102 (H) 03/02/2022   TRIG 97  03/02/2022   CHOLHDL 2.8 03/02/2022   Lab Results  Component Value Date   ALT 31 03/02/2022   AST 23 03/02/2022   PLT 174 12/29/2016   TSH 3.220 12/29/2016     She was last seen for this 6 months ago.  Management since that visit includes no change.  She reports excellent compliance with treatment. She is not having side effects.   The 10-year ASCVD risk score (Arnett DK, et al., 2019) is: 6.8%  --------------------------------------------------------------------------------------------------- Anxiety, Follow-up  She was last seen for anxiety 6 months ago. Changes made at last visit include no changes.   She reports excellent compliance with treatment. She reports excellent tolerance of treatment. She is not having side effects.   She feels her anxiety is mild and Unchanged since last visit.  Symptoms: No chest pain No difficulty concentrating  No dizziness No fatigue  No feelings of losing control No insomnia  No irritable No palpitations  No panic attacks No racing thoughts  No shortness of breath No sweating  No tremors/shakes    GAD-7 Results    09/02/2022    1:35 PM  GAD-7 Generalized Anxiety Disorder Screening Tool  1. Feeling Nervous, Anxious, or on Edge 1  2. Not Being Able to Stop or Control Worrying 0  3. Worrying Too Much About Different Things 0  4. Trouble Relaxing 0  5. Being So Restless it's Hard To Sit Still 0  6. Becoming Easily Annoyed or Irritable 0  7. Feeling Afraid As If Something Awful Might Happen 0  Total GAD-7 Score 1  Difficulty At Work, Home, or Getting  Along With Others? Not difficult at all    PHQ-9 Scores    09/02/2022    1:35 PM 11/03/2021    1:34 PM 02/26/2021    1:27 PM  PHQ9 SCORE ONLY  PHQ-9 Total Score 0 3 0    ---------------------------------------------------------------------------------------------------  Discussed the use of AI scribe software for clinical note transcription with the patient, who gave verbal  consent to proceed.  History of Present Illness   The patient, with a history of hypertension and anxiety, presents for a routine follow-up. She reports no new concerns and overall, things are 'good.' She is due for a colonoscopy in November, which has been scheduled. She had a mammogram in April, which was normal. She also received a tetanus shot earlier in the year after a kitchen accident involving a mandolin slicer, which resulted in a cut on her hand. 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 She is considering getting another tetanus shot due to doubts about the previous one.  The patient has stopped taking omeprazole for reflux about two years ago and manages occasional symptoms with Tums or sparkling mineral water. She also avoids eating late to prevent reflux symptoms. She continues to take triamterene HCTZ for blood pressure control and clonazepam for anxiety, both of which are well managed.        Medications: Outpatient Medications Prior to Visit  Medication Sig   acetaminophen (TYLENOL) 500 MG tablet Take 2 tablets (1,000 mg total) by mouth every 6 (six) hours as needed for mild pain.   ALLERGY RELIEF CETIRIZINE 10 MG tablet TAKE 1 TABLET BY MOUTH DAILY   aspirin 81 MG tablet Take 1 tablet (81 mg total) by mouth daily.   Biotin 5000 MCG CAPS Take by mouth.   Cranberry 425 MG CAPS Take by mouth.   ibuprofen (ADVIL) 600 MG tablet Take 1 tablet (600 mg total) by mouth every 8 (eight) hours as needed for moderate pain.   Krill Oil 1000 MG CAPS Take by mouth.   Magnesium 100 MG TABS Take by mouth.   Multiple Vitamin (MULTIVITAMIN) tablet Take 1 tablet by mouth daily.   VITAMIN E PO Take 16,000 Units daily by mouth.   [DISCONTINUED] clonazePAM (KLONOPIN) 0.5 MG tablet Take 1 tablet (0.5 mg total) by mouth daily.   [DISCONTINUED] omeprazole (PRILOSEC) 20 MG capsule TAKE 1 CAPSULE BY MOUTH  DAILY   [DISCONTINUED]  triamterene-hydrochlorothiazide (DYAZIDE) 37.5-25 MG capsule Take 1 each (1 capsule total) by mouth daily.   No facility-administered medications prior to visit.    Review of Systems      Objective    BP 102/71 (BP Location: Left Arm, Patient Position: Sitting, Cuff Size: Normal)   Pulse 75   Temp 98.2 F (36.8 C) (Temporal)   Resp 12   Ht 4' 10.5" (1.486 m)   Wt 150 lb 6.4 oz (68.2 kg)   SpO2 99%   BMI 30.90 kg/m  BP Readings from Last 3 Encounters:  09/02/22 102/71  03/02/22 108/80  11/03/21 124/81   Wt Readings from Last 3 Encounters:  09/02/22 150 lb 6.4 oz (68.2 kg)  03/02/22 151 lb (68.5 kg)  11/03/21 144 lb 12.8 oz (65.7 kg)      Physical Exam Vitals reviewed.  Constitutional:      General: She is  not in acute distress.    Appearance: Normal appearance. She is well-developed. She is not diaphoretic.  HENT:     Head: Normocephalic and atraumatic.  Eyes:     General: No scleral icterus.    Conjunctiva/sclera: Conjunctivae normal.  Neck:     Thyroid: No thyromegaly.  Cardiovascular:     Rate and Rhythm: Normal rate and regular rhythm.     Heart sounds: Normal heart sounds. No murmur heard. Pulmonary:     Effort: Pulmonary effort is normal. No respiratory distress.     Breath sounds: Normal breath sounds. No wheezing, rhonchi or rales.  Musculoskeletal:     Cervical back: Neck supple.     Right lower leg: No edema.     Left lower leg: No edema.  Lymphadenopathy:     Cervical: No cervical adenopathy.  Skin:    General: Skin is warm and dry.     Findings: No rash.  Neurological:     Mental Status: She is alert and oriented to person, place, and time. Mental status is at baseline.  Psychiatric:        Mood and Affect: Mood normal.        Behavior: Behavior normal.       No results found for any visits on 09/02/22.  Assessment & Plan     Problem List Items Addressed This Visit       Cardiovascular and Mediastinum   Essential hypertension,  benign - Primary    Well controlled Continue current medications - Triamterene HCTZ. Recheck metabolic panel F/u in 6 months       Relevant Medications   triamterene-hydrochlorothiazide (DYAZIDE) 37.5-25 MG capsule     Digestive   Esophagitis, reflux     Symptoms managed with dietary modifications and occasional use of Tums. -Continue current management strategy. -Consider Pepcid as needed for symptom control.        Other   Insomnia    Stable on Clonazepam 0.5mg  daily. -Continue current medication regimen. Chronic and stable Failed trazodone or melatonin in the past  Still has occasional nights with decreased sleep Using klonopin regularly Have discussed long term risks with benzos      Pure hypercholesterolemia   Relevant Medications   triamterene-hydrochlorothiazide (DYAZIDE) 37.5-25 MG capsule   Other Visit Diagnoses     Anxiety       Need for Tdap vaccination       Relevant Orders   Tdap vaccine greater than or equal to 7yo IM (Completed)            Tetanus Immunization: Concerns about the validity of a recent tetanus shot received at an urgent care facility. -Administer tetanus shot today.  General Health Maintenance / Followup Plans: -Colonoscopy due in November. Patient aware and will schedule with gastroenterology. -Annual mammogram completed in April was normal. -Check cholesterol annually. -Check kidney function and electrolytes every six months. Patient to provide recent labs from employer's health screening. -Schedule physical exam in six months.        Return in about 6 months (around 03/05/2023) for CPE.      I, Shirlee Latch, MD, have reviewed all documentation for this visit. The documentation on 09/03/22 for the exam, diagnosis, procedures, and orders are all accurate and complete.   Avry Monteleone, Marzella Schlein, MD, MPH [facility]

## 2022-09-03 NOTE — Assessment & Plan Note (Signed)
Stable on Clonazepam 0.5mg  daily. -Continue current medication regimen. Chronic and stable Failed trazodone or melatonin in the past  Still has occasional nights with decreased sleep Using klonopin regularly Have discussed long term risks with benzos

## 2022-09-03 NOTE — Assessment & Plan Note (Signed)
Symptoms managed with dietary modifications and occasional use of Tums. -Continue current management strategy. -Consider Pepcid as needed for symptom control.

## 2022-09-03 NOTE — Assessment & Plan Note (Signed)
Well controlled Continue current medications - Triamterene HCTZ. Recheck metabolic panel F/u in 6 months

## 2022-09-25 ENCOUNTER — Other Ambulatory Visit: Payer: Self-pay | Admitting: Family Medicine

## 2022-09-25 DIAGNOSIS — K2100 Gastro-esophageal reflux disease with esophagitis, without bleeding: Secondary | ICD-10-CM

## 2022-09-27 NOTE — Telephone Encounter (Signed)
Requested Prescriptions  Refused Prescriptions Disp Refills   omeprazole (PRILOSEC) 20 MG capsule [Pharmacy Med Name: Omeprazole 20 MG Oral Capsule Delayed Release] 90 capsule 3    Sig: TAKE 1 CAPSULE BY MOUTH DAILY     Gastroenterology: Proton Pump Inhibitors Passed - 09/25/2022 10:21 PM      Passed - Valid encounter within last 12 months    Recent Outpatient Visits           3 weeks ago Essential hypertension, benign   [facility] [place], Marzella Schlein, MD   6 months ago Encounter for annual physical exam   [facility] [place], Marzella Schlein, MD   10 months ago Essential hypertension, benign   [facility] Caro Laroche, DO   1 year ago Encounter for annual physical exam   [place] [facility] Bery[facility] Flock, Marzella Schlein, MD   2 years ago Essential hypertension, benign   [place] [facility] [place], Marzella Schlein, MD       Future Appointments             In 5 months [name], Marzella Schlein, MD [facility]

## 2022-12-13 ENCOUNTER — Ambulatory Visit: Payer: 59

## 2023-01-19 ENCOUNTER — Telehealth: Payer: Self-pay

## 2023-01-19 NOTE — Telephone Encounter (Signed)
Colonoscopy rescheduled to 04/01/23 [facility] in [facility] notified.  Thanks,  [place], [place]

## 2023-01-19 NOTE — Telephone Encounter (Signed)
The patient called and left a voicemail wanting to reschedule her colonoscopy to a later date. I called the patient back to inform him we received his message, and I let her know message was sent to the scheduler. She want her colonoscopy and she will like to see that her 01/28/2023 colonoscopy is cancel in writing.

## 2023-03-07 ENCOUNTER — Ambulatory Visit: Payer: 59 | Admitting: Family Medicine

## 2023-03-07 ENCOUNTER — Encounter: Payer: Self-pay | Admitting: Family Medicine

## 2023-03-07 VITALS — BP 114/68 | HR 83 | Ht <= 58 in | Wt 153.1 lb

## 2023-03-07 DIAGNOSIS — Z0001 Encounter for general adult medical examination with abnormal findings: Secondary | ICD-10-CM | POA: Diagnosis not present

## 2023-03-07 DIAGNOSIS — E7800 Pure hypercholesterolemia, unspecified: Secondary | ICD-10-CM | POA: Diagnosis not present

## 2023-03-07 DIAGNOSIS — I10 Essential (primary) hypertension: Secondary | ICD-10-CM | POA: Diagnosis not present

## 2023-03-07 DIAGNOSIS — Z0000 Encounter for general adult medical examination without abnormal findings: Secondary | ICD-10-CM

## 2023-03-07 DIAGNOSIS — F5101 Primary insomnia: Secondary | ICD-10-CM

## 2023-03-07 DIAGNOSIS — Z23 Encounter for immunization: Secondary | ICD-10-CM | POA: Diagnosis not present

## 2023-03-07 DIAGNOSIS — Z1231 Encounter for screening mammogram for malignant neoplasm of breast: Secondary | ICD-10-CM

## 2023-03-07 MED ORDER — CLONAZEPAM 0.5 MG PO TABS: 0.5000 mg | ORAL_TABLET | Freq: Every day | ORAL | 1 refills | Status: DC

## 2023-03-07 MED ORDER — TRIAMTERENE-HCTZ 37.5-25 MG PO CAPS: 1.0000 | ORAL_CAPSULE | Freq: Every day | ORAL | 3 refills | Status: DC

## 2023-03-07 NOTE — Patient Instructions (Addendum)

## 2023-03-07 NOTE — Assessment & Plan Note (Signed)
 Blood pressure well-controlled on current medication regimen (triamterene HCTZ daily). - Continue triamterene HCTZ daily

## 2023-03-07 NOTE — Progress Notes (Signed)
 Complete physical exam   Patient: Danielle Ortega   DOB: 1955/09/16   68 y.o. Female  MRN: [id number] Visit Date: 03/07/2023  Today's healthcare provider: Jon Eva, MD   Chief Complaint  Patient presents with   Annual Exam    Diet - General; unhealthy Exercise - none Feeling - well Sleeping - well Concerns - none   Subjective    Danielle Ortega is a 68 y.o. female who presents today for a complete physical exam.   Discussed the use of AI scribe software for clinical note transcription with the patient, who gave verbal consent to proceed.  History of Present Illness   The patient, with a history of polyps, is due for a colonoscopy but is considering delaying the procedure due to insurance issues. The patient acknowledges the need for the procedure due to previous polyps and is aware of the need to seek medical attention if symptoms such as blood in stool or unexpected weight loss occur. The patient is also due for a mammogram in April and is aware of the need to schedule this.  The patient is currently taking triamterene  HCTZ, baby aspirin , and clonazepam . The patient reports that these medications are managing her conditions well. The patient also reports some blurring of vision, which may be due to extensive computer use and reading. The patient has not seen an eye doctor in approximately four years and acknowledges the need for an eye exam.  The patient also reports some anxiety related to work. The patient describes some days as being more stressful than others, but overall, the patient's anxiety and sleep are in a good place with the current medication regimen.        Last depression screening scores    09/02/2022    1:35 PM 11/03/2021    1:34 PM 02/26/2021    1:27 PM  PHQ 2/9 Scores  PHQ - 2 Score 0 0 0  PHQ- 9 Score  3    Last fall risk screening    09/02/2022    1:35 PM  Fall Risk   Falls in the past year? 0  Number falls in past yr: 0  Injury with  Fall? 0  Risk for fall due to : No Fall Risks  Follow up Falls evaluation completed        Medications: Outpatient Medications Prior to Visit  Medication Sig   acetaminophen  (TYLENOL ) 500 MG tablet Take 2 tablets (1,000 mg total) by mouth every 6 (six) hours as needed for mild pain.   ALLERGY RELIEF CETIRIZINE  10 MG tablet TAKE 1 TABLET BY MOUTH DAILY   aspirin  81 MG tablet Take 1 tablet (81 mg total) by mouth daily.   Biotin 5000 MCG CAPS Take by mouth.   Cranberry 425 MG CAPS Take by mouth.   ibuprofen  (ADVIL ) 600 MG tablet Take 1 tablet (600 mg total) by mouth every 8 (eight) hours as needed for moderate pain.   Krill Oil 1000 MG CAPS Take by mouth.   Magnesium 100 MG TABS Take by mouth.   Multiple Vitamin (MULTIVITAMIN) tablet Take 1 tablet by mouth daily.   VITAMIN E PO Take 16,000 Units daily by mouth.   [DISCONTINUED] clonazePAM  (KLONOPIN ) 0.5 MG tablet Take 1 tablet (0.5 mg total) by mouth daily.   [DISCONTINUED] triamterene -hydrochlorothiazide (DYAZIDE) 37.5-25 MG capsule Take 1 each (1 capsule total) by mouth daily.   No facility-administered medications prior to visit.    Review of Systems  Objective    BP 114/68 (BP Location: Left Arm, Patient Position: Sitting, Cuff Size: Normal)   Pulse 83   Ht 4' 9 (1.448 m)   Wt 153 lb 1.6 oz (69.4 kg)   SpO2 98%   BMI 33.13 kg/m    Physical Exam Vitals reviewed.  Constitutional:      General: She is not in acute distress.    Appearance: Normal appearance. She is well-developed. She is not diaphoretic.  HENT:     Head: Normocephalic and atraumatic.     Right Ear: Tympanic membrane, ear canal and external ear normal.     Left Ear: Tympanic membrane, ear canal and external ear normal.     Nose: Nose normal.     Mouth/Throat:     Mouth: Mucous membranes are moist.     Pharynx: Oropharynx is clear. No oropharyngeal exudate.  Eyes:     General: No scleral icterus.    Conjunctiva/sclera: Conjunctivae normal.      Pupils: Pupils are equal, round, and reactive to light.  Neck:     Thyroid: No thyromegaly.  Cardiovascular:     Rate and Rhythm: Normal rate and regular rhythm.     Heart sounds: Normal heart sounds. No murmur heard. Pulmonary:     Effort: Pulmonary effort is normal. No respiratory distress.     Breath sounds: Normal breath sounds. No wheezing or rales.  Abdominal:     General: There is no distension.     Palpations: Abdomen is soft.     Tenderness: There is no abdominal tenderness.  Musculoskeletal:        General: No deformity.     Cervical back: Neck supple.     Right lower leg: No edema.     Left lower leg: No edema.  Lymphadenopathy:     Cervical: No cervical adenopathy.  Skin:    General: Skin is warm and dry.     Findings: No rash.  Neurological:     Mental Status: She is alert and oriented to person, place, and time. Mental status is at baseline.     Gait: Gait normal.  Psychiatric:        Mood and Affect: Mood normal.        Behavior: Behavior normal.        Thought Content: Thought content normal.      No results found for any visits on 03/07/23.  Assessment & Plan    Routine Health Maintenance and Physical Exam  Exercise Activities and Dietary recommendations  Goals   None     Immunization History  Administered Date(s) Administered   Fluad Quad(high Dose 65+) 10/18/2018, 12/10/2020, 11/03/2021   Fluad Trivalent(High Dose 65+) 03/07/2023   Influenza,inj,Quad PF,6+ Mos 12/27/2016, 12/28/2017, 01/23/2020   Moderna Covid-19 Vaccine Bivalent Booster 83yrs & up 12/18/2020   PFIZER Comirnaty(Gray Top)Covid-19 Tri-Sucrose Vaccine 07/16/2020   PFIZER(Purple Top)SARS-COV-2 Vaccination 04/19/2019, 05/10/2019, 01/23/2020   Pfizer(Comirnaty)Fall Seasonal Vaccine 12 years and older 03/02/2022   Pneumococcal Conjugate-13 01/26/2019   Pneumococcal Polysaccharide-23 03/03/2020   Tdap 09/10/2011, 09/02/2022   Zoster Recombinant(Shingrix ) 01/26/2019, 03/30/2019     Health Maintenance  Topic Date Due   COVID-19 Vaccine (7 - 2024-25 season) 10/24/2022   Colonoscopy  01/13/2023   MAMMOGRAM  06/21/2024   DTaP/Tdap/Td (3 - Td or Tdap) 09/01/2032   Pneumonia Vaccine 61+ Years old  Completed   INFLUENZA VACCINE  Completed   DEXA SCAN  Completed   Hepatitis C Screening  Completed   Zoster Vaccines-  Shingrix   Completed   HPV VACCINES  Aged Out    Discussed health benefits of physical activity, and encouraged her to engage in regular exercise appropriate for her age and condition.  Problem List Items Addressed This Visit       Cardiovascular and Mediastinum   Essential hypertension, benign   Blood pressure well-controlled on current medication regimen (triamterene  HCTZ daily). - Continue triamterene  HCTZ daily      Relevant Medications   triamterene -hydrochlorothiazide (DYAZIDE) 37.5-25 MG capsule   Other Relevant Orders   Comprehensive metabolic panel     Other   Insomnia   well-managed with clonazepam  0.5 mg daily. Patient reports good control of anxiety and sleep. - Continue clonazepam  0.5 mg daily - Send prescription refill to [facility]      Pure hypercholesterolemia   Reviewed last lipid panel Not currently on a statin Recheck FLP and CMP Discussed diet and exercise       Relevant Medications   triamterene -hydrochlorothiazide (DYAZIDE) 37.5-25 MG capsule   Other Relevant Orders   Lipid Panel With LDL/HDL Ratio   Other Visit Diagnoses       Encounter for annual physical exam    -  Primary     Influenza vaccine needed       Relevant Orders   Flu Vaccine Trivalent High Dose (Fluad) (Completed)     Breast cancer screening by mammogram       Relevant Orders   MM 3D SCREENING MAMMOGRAM BILATERAL BREAST       Assessment and Plan    Colorectal Polyps Colorectal polyps identified during a 2019 colonoscopy. Follow-up colonoscopy recommended in 5 years. Patient concerned about insurance coverage; advised Cologuard is  unsuitable due to polyp history. Discussed importance of monitoring for symptoms like blood in stool and unexpected weight loss. Patient plans to delay procedure by one year due to insurance issues and will consider Medicare coverage next year. - colonoscopy due in 2024, but does not have insurance coverage currently - will plan for Jan 2026 - Monitor for symptoms such as blood in stool and unexpected weight loss - Advise patient to contact if symptoms occur  Vision Changes Progressive blurring of vision, especially with prolonged laptop use and reading. Last eye exam was approximately four years ago. Discussed possible cataracts. - Recommend eye exam  COVID-19 Vaccination Reaction Significant illness for four days following COVID-19 booster shot in the fall. Advised to delay flu shot at that time. - Administer flu shot today  General Health Maintenance Up to date on pneumonia and shingles vaccinations. Tetanus not due until 2034. Mammogram last done in April 2024, due again in April 2025. Routine labs including kidney function, liver function, electrolytes, cholesterol, and blood sugar are due. - Administer flu shot - Order routine labs - Send mammogram order to [facility] - Schedule follow-up in 6 months.          Return in about 6 months (around 09/04/2023) for chronic disease f/u.     Jon Eva, MD  [facility] [phone] (phone) [phone] (fax)  [facility]

## 2023-03-07 NOTE — Assessment & Plan Note (Signed)
 Reviewed last lipid panel Not currently on a statin Recheck FLP and CMP Discussed diet and exercise

## 2023-03-07 NOTE — Assessment & Plan Note (Signed)
 well-managed with clonazepam 0.5 mg daily. Patient reports good control of anxiety and sleep. - Continue clonazepam 0.5 mg daily - Send prescription refill to [facility]

## 2023-03-08 ENCOUNTER — Other Ambulatory Visit: Payer: Self-pay

## 2023-03-08 DIAGNOSIS — N289 Disorder of kidney and ureter, unspecified: Secondary | ICD-10-CM

## 2023-03-08 LAB — COMPREHENSIVE METABOLIC PANEL
ALT: 34 [IU]/L — ABNORMAL HIGH (ref 0–32)
AST: 25 [IU]/L (ref 0–40)
Albumin: 4.3 g/dL (ref 3.9–4.9)
Alkaline Phosphatase: 44 [IU]/L (ref 44–121)
BUN/Creatinine Ratio: 18 (ref 12–28)
BUN: 22 mg/dL (ref 8–27)
Bilirubin Total: 0.2 mg/dL (ref 0.0–1.2)
CO2: 26 mmol/L (ref 20–29)
Calcium: 9.8 mg/dL (ref 8.7–10.3)
Chloride: 105 mmol/L (ref 96–106)
Creatinine, Ser: 1.24 mg/dL — ABNORMAL HIGH (ref 0.57–1.00)
Globulin, Total: 1.8 g/dL (ref 1.5–4.5)
Glucose: 105 mg/dL — ABNORMAL HIGH (ref 70–99)
Potassium: 3.8 mmol/L (ref 3.5–5.2)
Sodium: 145 mmol/L — ABNORMAL HIGH (ref 134–144)
Total Protein: 6.1 g/dL (ref 6.0–8.5)
eGFR: 47 mL/min/{1.73_m2} — ABNORMAL LOW (ref >59)

## 2023-03-08 LAB — LIPID PANEL WITH LDL/HDL RATIO
Cholesterol, Total: 177 mg/dL (ref 100–199)
HDL: 54 mg/dL (ref >39)
LDL Chol Calc (NIH): 92 mg/dL (ref 0–99)
LDL/HDL Ratio: 1.7 {ratio} (ref 0.0–3.2)
Triglycerides: 179 mg/dL — ABNORMAL HIGH (ref 0–149)
VLDL Cholesterol Cal: 31 mg/dL (ref 5–40)

## 2023-04-01 ENCOUNTER — Ambulatory Visit: Admission: RE | Admit: 2023-04-01 | Payer: 59 | Source: Home / Self Care | Admitting: Gastroenterology

## 2023-04-01 ENCOUNTER — Encounter: Admission: RE | Payer: Self-pay | Source: Home / Self Care

## 2023-04-01 SURGERY — COLONOSCOPY WITH PROPOFOL
Anesthesia: General

## 2023-08-12 ENCOUNTER — Other Ambulatory Visit: Payer: Self-pay | Admitting: Family Medicine

## 2023-08-17 ENCOUNTER — Telehealth: Payer: Self-pay | Admitting: Family Medicine

## 2023-08-17 NOTE — Telephone Encounter (Signed)
 Called and left voicemail 7/14/ appt Dr B is no longer available and needs to reschedule. Ok to reschedule if patient calls back

## 2023-09-05 ENCOUNTER — Ambulatory Visit: Payer: Self-pay | Admitting: Family Medicine

## 2023-10-04 ENCOUNTER — Ambulatory Visit (INDEPENDENT_AMBULATORY_CARE_PROVIDER_SITE_OTHER): Admitting: Family Medicine

## 2023-10-04 VITALS — BP 117/74 | HR 70 | Temp 98.1°F | Ht <= 58 in | Wt 153.4 lb

## 2023-10-04 DIAGNOSIS — F5101 Primary insomnia: Secondary | ICD-10-CM | POA: Diagnosis not present

## 2023-10-04 DIAGNOSIS — J301 Allergic rhinitis due to pollen: Secondary | ICD-10-CM

## 2023-10-04 DIAGNOSIS — I10 Essential (primary) hypertension: Secondary | ICD-10-CM

## 2023-10-04 DIAGNOSIS — R3989 Other symptoms and signs involving the genitourinary system: Secondary | ICD-10-CM | POA: Diagnosis not present

## 2023-10-04 MED ORDER — CETIRIZINE HCL 10 MG PO TABS: 10.0000 mg | ORAL_TABLET | Freq: Every day | ORAL | 3 refills | Status: AC

## 2023-10-04 NOTE — Assessment & Plan Note (Signed)
 Blood pressure is well-controlled with current medication regimen. She is taking a combination pill, Dyazide, which appears to be effective. - Continue current medication regimen with Dyazide. - Monitor kidney function and electrolytes with upcoming [facility] screening.

## 2023-10-04 NOTE — Assessment & Plan Note (Signed)
 Insomnia is managed with Klonopin  0.5 mg as needed for sleep. She reports using it occasionally, especially when going to bed late. - Continue Klonopin  0.5 mg as needed for sleep.

## 2023-10-04 NOTE — Progress Notes (Signed)
 Established patient visit   Patient: Danielle Ortega   DOB: Jun 25, 1955   68 y.o. Female  MRN: [id number] Visit Date: 10/04/2023  Today's healthcare provider: Jon Eva, MD   Chief Complaint  Patient presents with   Hypertension    Hypertension Patient is here for follow-up of elevated blood pressure. She is not exercising and is not adherent to a low-salt diet. Blood pressure is not monitoring at home.   Subjective    Hypertension   HPI     Hypertension    Additional comments: Hypertension Patient is here for follow-up of elevated blood pressure. She is not exercising and is not adherent to a low-salt diet. Blood pressure is not monitoring at home.      Last edited by Terrel Powell LITTIE, CMA on 10/04/2023  3:24 PM.       Discussed the use of AI scribe software for clinical note transcription with the patient, who gave verbal consent to proceed.  History of Present Illness   Danielle Ortega is a 68 year old female who presents for a routine follow-up visit.  Her blood pressure is well-controlled with her current medication regimen, which includes a combination pill containing Dyazide. She is satisfied with the medication's effectiveness.  She takes Klonopin  0.5 mg for sleep as needed, particularly when going to bed late, and often omits it when going to bed at a reasonable time.  She experiences bladder aching sometimes, especially in the morning after urination, without dysuria, increased frequency, or urgency. She occasionally wakes at night to urinate but not consistently. She is concerned about a possible infection but does not suspect a urinary tract infection due to the absence of hematuria.  She has difficulty drinking water but consumes caffeine-free tea, some milk, and one cup of coffee in the morning. She takes cranberry pills to help against bacteria but acknowledges they do not prevent irritation.  She needs a refill for cetirizine , which is  more affordable by prescription than over-the-counter.         Medications: Outpatient Medications Prior to Visit  Medication Sig   acetaminophen  (TYLENOL ) 500 MG tablet Take 2 tablets (1,000 mg total) by mouth every 6 (six) hours as needed for mild pain.   aspirin  81 MG tablet Take 1 tablet (81 mg total) by mouth daily.   Biotin 5000 MCG CAPS Take by mouth.   clonazePAM  (KLONOPIN ) 0.5 MG tablet TAKE 1 TABLET BY MOUTH DAILY   Cranberry 425 MG CAPS Take by mouth.   Krill Oil 1000 MG CAPS Take by mouth.   Magnesium 100 MG TABS Take by mouth.   Multiple Vitamin (MULTIVITAMIN) tablet Take 1 tablet by mouth daily.   triamterene -hydrochlorothiazide (DYAZIDE) 37.5-25 MG capsule Take 1 each (1 capsule total) by mouth daily.   VITAMIN E PO Take 16,000 Units daily by mouth.   [DISCONTINUED] ALLERGY RELIEF CETIRIZINE  10 MG tablet TAKE 1 TABLET BY MOUTH DAILY   [DISCONTINUED] ibuprofen  (ADVIL ) 600 MG tablet Take 1 tablet (600 mg total) by mouth every 8 (eight) hours as needed for moderate pain.   No facility-administered medications prior to visit.    Review of Systems     Objective    BP 117/74 (BP Location: Left Arm, Patient Position: Sitting, Cuff Size: Normal)   Pulse 70   Temp 98.1 F (36.7 C) (Oral)   Ht 4' 10 (1.473 m)   Wt 153 lb 6.4 oz (69.6 kg)   SpO2 98%  BMI 32.06 kg/m    Physical Exam Vitals reviewed.  Constitutional:      General: She is not in acute distress.    Appearance: Normal appearance. She is well-developed. She is not diaphoretic.  HENT:     Head: Normocephalic and atraumatic.  Eyes:     General: No scleral icterus.    Conjunctiva/sclera: Conjunctivae normal.  Neck:     Thyroid: No thyromegaly.  Cardiovascular:     Rate and Rhythm: Normal rate and regular rhythm.     Heart sounds: Normal heart sounds. No murmur heard. Pulmonary:     Effort: Pulmonary effort is normal. No respiratory distress.     Breath sounds: Normal breath sounds. No wheezing,  rhonchi or rales.  Musculoskeletal:     Cervical back: Neck supple.     Right lower leg: No edema.     Left lower leg: No edema.  Lymphadenopathy:     Cervical: No cervical adenopathy.  Skin:    General: Skin is warm and dry.     Findings: No rash.  Neurological:     Mental Status: She is alert and oriented to person, place, and time. Mental status is at baseline.  Psychiatric:        Mood and Affect: Mood normal.        Behavior: Behavior normal.      No results found for any visits on 10/04/23.  Assessment & Plan     Problem List Items Addressed This Visit       Cardiovascular and Mediastinum   Essential hypertension, benign - Primary   Blood pressure is well-controlled with current medication regimen. She is taking a combination pill, Dyazide, which appears to be effective. - Continue current medication regimen with Dyazide. - Monitor kidney function and electrolytes with upcoming [facility] screening.        Respiratory   Allergic rhinitis   Allergic rhinitis is managed with cetirizine , which is preferred by prescription due to cost. - Send prescription for cetirizine .         Other   Insomnia   Insomnia is managed with Klonopin  0.5 mg as needed for sleep. She reports using it occasionally, especially when going to bed late. - Continue Klonopin  0.5 mg as needed for sleep.      Other Visit Diagnoses       Bladder pain               Bladder discomfort Intermittent bladder discomfort, particularly in the morning, likely due to bladder stretching overnight or irritation. No signs of infection or increased urination frequency. Discussed potential irritants such as caffeine and dehydration. - Increase water intake to reduce bladder irritation.      Return in about 6 months (around 04/05/2024) for CPE.       Jon Eva, MD  [facility] [phone] (phone) [phone] (fax)  [facility]

## 2023-10-04 NOTE — Assessment & Plan Note (Signed)
 Allergic rhinitis is managed with cetirizine , which is preferred by prescription due to cost. - Send prescription for cetirizine .

## 2024-01-24 ENCOUNTER — Other Ambulatory Visit: Payer: Self-pay | Admitting: Family Medicine

## 2024-01-25 NOTE — Telephone Encounter (Signed)
 Please review in provider's absence.  Thank you.   LOV- 10/04/2023 NOV- 04/05/2024 LRF- 03/07/2023 Outpatient Medication Detail   Disp Refills Start End   triamterene -hydrochlorothiazide (DYAZIDE) 37.5-25 MG capsule 90 capsule 3 03/07/2023 --   Sig - Route: Take 1 each (1 capsule total) by mouth daily. - Oral   Sent to pharmacy as: triamterene -hydrochlorothiazide (DYAZIDE) 37.5-25 MG capsule   E-Prescribing Status: Receipt confirmed by pharmacy (03/07/2023  4:14 PM EST)

## 2024-02-07 ENCOUNTER — Other Ambulatory Visit: Payer: Self-pay | Admitting: Family Medicine

## 2024-04-05 ENCOUNTER — Encounter: Admitting: Family Medicine
